# Patient Record
Sex: Female | Born: 1975 | Race: White | Hispanic: No | Marital: Married | Smoking: Never smoker
Health system: Southern US, Community
[De-identification: ages and names within clinical notes are randomized; demographics above are authoritative.]

## PROBLEM LIST (undated history)

## (undated) DIAGNOSIS — M1990 Unspecified osteoarthritis, unspecified site: Secondary | ICD-10-CM

## (undated) DIAGNOSIS — F419 Anxiety disorder, unspecified: Secondary | ICD-10-CM

---
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
## 1998-10-06 ENCOUNTER — Other Ambulatory Visit: Admission: RE | Admit: 1998-10-06 | Discharge: 1998-10-06 | Payer: Self-pay | Admitting: Obstetrics and Gynecology

## 1998-11-15 ENCOUNTER — Other Ambulatory Visit: Admission: RE | Admit: 1998-11-15 | Discharge: 1998-11-15 | Payer: Self-pay | Admitting: Obstetrics and Gynecology

## 1998-11-15 ENCOUNTER — Encounter (INDEPENDENT_AMBULATORY_CARE_PROVIDER_SITE_OTHER): Payer: Self-pay | Admitting: Specialist

## 1999-05-19 ENCOUNTER — Other Ambulatory Visit: Admission: RE | Admit: 1999-05-19 | Discharge: 1999-05-19 | Payer: Self-pay | Admitting: Obstetrics and Gynecology

## 1999-05-28 ENCOUNTER — Observation Stay (HOSPITAL_COMMUNITY): Admission: EM | Admit: 1999-05-28 | Discharge: 1999-05-30 | Payer: Self-pay | Admitting: Emergency Medicine

## 1999-05-28 ENCOUNTER — Encounter (HOSPITAL_BASED_OUTPATIENT_CLINIC_OR_DEPARTMENT_OTHER): Payer: Self-pay | Admitting: General Surgery

## 1999-08-15 ENCOUNTER — Other Ambulatory Visit: Admission: RE | Admit: 1999-08-15 | Discharge: 1999-08-15 | Payer: Self-pay | Admitting: Obstetrics and Gynecology

## 1999-08-29 ENCOUNTER — Encounter: Admission: RE | Admit: 1999-08-29 | Discharge: 1999-08-29 | Payer: Self-pay | Admitting: Obstetrics and Gynecology

## 1999-08-29 ENCOUNTER — Encounter: Payer: Self-pay | Admitting: Obstetrics and Gynecology

## 2000-01-19 ENCOUNTER — Other Ambulatory Visit: Admission: RE | Admit: 2000-01-19 | Discharge: 2000-01-19 | Payer: Self-pay | Admitting: Obstetrics and Gynecology

## 2000-08-23 ENCOUNTER — Other Ambulatory Visit: Admission: RE | Admit: 2000-08-23 | Discharge: 2000-08-23 | Payer: Self-pay | Admitting: Obstetrics and Gynecology

## 2001-12-10 ENCOUNTER — Other Ambulatory Visit: Admission: RE | Admit: 2001-12-10 | Discharge: 2001-12-10 | Payer: Self-pay | Admitting: Obstetrics and Gynecology

## 2002-02-24 ENCOUNTER — Other Ambulatory Visit: Admission: RE | Admit: 2002-02-24 | Discharge: 2002-02-24 | Payer: Self-pay | Admitting: Obstetrics and Gynecology

## 2002-03-06 ENCOUNTER — Ambulatory Visit (HOSPITAL_COMMUNITY): Admission: RE | Admit: 2002-03-06 | Discharge: 2002-03-06 | Payer: Self-pay | Admitting: Obstetrics and Gynecology

## 2002-03-06 ENCOUNTER — Encounter (INDEPENDENT_AMBULATORY_CARE_PROVIDER_SITE_OTHER): Payer: Self-pay

## 2002-05-16 ENCOUNTER — Emergency Department (HOSPITAL_COMMUNITY): Admission: EM | Admit: 2002-05-16 | Discharge: 2002-05-17 | Payer: Self-pay | Admitting: Emergency Medicine

## 2002-10-30 ENCOUNTER — Ambulatory Visit (HOSPITAL_COMMUNITY): Admission: RE | Admit: 2002-10-30 | Discharge: 2002-10-30 | Payer: Self-pay | Admitting: Obstetrics and Gynecology

## 2002-10-30 ENCOUNTER — Encounter: Payer: Self-pay | Admitting: Obstetrics and Gynecology

## 2002-11-20 ENCOUNTER — Encounter: Payer: Self-pay | Admitting: Obstetrics and Gynecology

## 2002-11-20 ENCOUNTER — Ambulatory Visit (HOSPITAL_COMMUNITY): Admission: RE | Admit: 2002-11-20 | Discharge: 2002-11-20 | Payer: Self-pay | Admitting: Obstetrics and Gynecology

## 2003-01-04 ENCOUNTER — Encounter: Payer: Self-pay | Admitting: Obstetrics and Gynecology

## 2003-01-04 ENCOUNTER — Ambulatory Visit (HOSPITAL_COMMUNITY): Admission: RE | Admit: 2003-01-04 | Discharge: 2003-01-04 | Payer: Self-pay | Admitting: Obstetrics and Gynecology

## 2003-01-11 ENCOUNTER — Other Ambulatory Visit: Admission: RE | Admit: 2003-01-11 | Discharge: 2003-01-11 | Payer: Self-pay | Admitting: Obstetrics and Gynecology

## 2003-01-13 ENCOUNTER — Inpatient Hospital Stay (HOSPITAL_COMMUNITY): Admission: AD | Admit: 2003-01-13 | Discharge: 2003-01-13 | Payer: Self-pay | Admitting: Obstetrics and Gynecology

## 2003-02-04 ENCOUNTER — Encounter: Admission: RE | Admit: 2003-02-04 | Discharge: 2003-02-04 | Payer: Self-pay | Admitting: Obstetrics and Gynecology

## 2003-02-15 ENCOUNTER — Ambulatory Visit (HOSPITAL_COMMUNITY): Admission: RE | Admit: 2003-02-15 | Discharge: 2003-02-15 | Payer: Self-pay | Admitting: Obstetrics and Gynecology

## 2003-03-31 ENCOUNTER — Inpatient Hospital Stay (HOSPITAL_COMMUNITY): Admission: AD | Admit: 2003-03-31 | Discharge: 2003-04-04 | Payer: Self-pay | Admitting: Obstetrics and Gynecology

## 2003-04-01 ENCOUNTER — Encounter (INDEPENDENT_AMBULATORY_CARE_PROVIDER_SITE_OTHER): Payer: Self-pay | Admitting: *Deleted

## 2005-03-11 ENCOUNTER — Emergency Department (HOSPITAL_COMMUNITY): Admission: EM | Admit: 2005-03-11 | Discharge: 2005-03-11 | Payer: Self-pay | Admitting: Emergency Medicine

## 2006-02-16 ENCOUNTER — Emergency Department (HOSPITAL_COMMUNITY): Admission: EM | Admit: 2006-02-16 | Discharge: 2006-02-16 | Payer: Self-pay | Admitting: Emergency Medicine

## 2006-03-01 ENCOUNTER — Encounter: Admission: RE | Admit: 2006-03-01 | Discharge: 2006-03-01 | Payer: Self-pay | Admitting: Gastroenterology

## 2007-04-14 ENCOUNTER — Inpatient Hospital Stay (HOSPITAL_COMMUNITY): Admission: AD | Admit: 2007-04-14 | Discharge: 2007-04-14 | Payer: Self-pay | Admitting: Obstetrics and Gynecology

## 2007-05-14 ENCOUNTER — Inpatient Hospital Stay (HOSPITAL_COMMUNITY): Admission: AD | Admit: 2007-05-14 | Discharge: 2007-05-14 | Payer: Self-pay | Admitting: Obstetrics and Gynecology

## 2007-06-19 ENCOUNTER — Encounter (INDEPENDENT_AMBULATORY_CARE_PROVIDER_SITE_OTHER): Payer: Self-pay | Admitting: Obstetrics and Gynecology

## 2007-06-19 ENCOUNTER — Inpatient Hospital Stay (HOSPITAL_COMMUNITY): Admission: AD | Admit: 2007-06-19 | Discharge: 2007-06-22 | Payer: Self-pay | Admitting: Obstetrics and Gynecology

## 2010-08-08 NOTE — Op Note (Signed)
NAMEMarland Kitchen  [name], [name]          ACCOUNT NO.:  [id number]   MEDICAL RECORD NO.:  [id number]          PATIENT TYPE:  INP   LOCATION:  9130                          [facility]ILITY:  [facility]   PHYSICIAN:  Malachi Pro. Ambrose Mantle, M.D. DATE OF BIRTH:  [birth year]   DATE OF PROCEDURE:  06/19/2007  DATE OF DISCHARGE:                               OPERATIVE REPORT   PREOPERATIVE DIAGNOSIS:  Intrauterine pregnancy 37+ weeks.  Premature  rupture of membranes.  Prior C-section.  Declined vacuum-assisted  closure.   POSTOPERATIVE DIAGNOSIS:  Intrauterine pregnancy 37+ weeks.  Premature  rupture of membranes.  Prior C-section.  Declined vacuum-assisted  closure.  Very small seedling fibroids no more than a centimeter in  diameter, probably 3 and 3 all together.   OPERATION:  Low-transverse cervical C-section.   OPERATOR:  Malachi Pro. Ambrose Mantle, M.D.   Spinal anesthesia.   The patient was brought to the operating room and given a spinal  anesthetic by Dr. Pamalee Leyden.  Fetal heart tones were auscultated and normal  in the 130s.  The patient was placed in the left lateral tilt position.  The abdomen was prepped with Betadine solution.  The urethra were  prepped, and a Foley catheter was inserted to straight drain.  The  abdomen was then draped as a sterile field.  The old scar was utilized  to make an incision through the skin, subcutaneous tissue, and fascia.  The fascia was separated from the rectus muscles superiorly and  inferiorly.  The rectus muscle was split in the midline.  Peritoneum  opened vertically.  The lower uterine segment was exposed, and incision  was made into the myometrium in a transverse manner.  I went the rest  away into the amniotic sac with my finger and then enlarged the incision  bilaterally with bandage scissors, elevated the vertex into the  incisional opening, and with fundal pressure was able to deliver the  head through the incision.  The cord dropped down beside the shoulder,  but  it was not a nuchal cord.  The rest of the body was delivered after  the nose and mouth were suctioned with the bulb.  The cord was clamped.  The infant was given to Dr. Venetia Constable who was in attendance.  She  assigned the 6-pound 13-ounce female infant Apgars of 9 at one and 9 at  five minutes.  Placenta was removed.  The entire uterus was inspected,  found to be free of any debris.  The cervix had been 1 cm dilated, so I  did not dilate it further.  The uterine incision was then closed with 2  running sutures of 0 Vicryl, locking the first layer, nonlocking on the  second layer.  Hemostasis was achieved with a combination of Bovie and a  couple additional sutures of 3-0 Vicryl.  Both tubes and ovaries  appeared normal.  There were probably 3 very small less than 1-cm  seedling fibroids on the uterine surface.  Hemostasis was completed  after irrigation was done, and the abdominal wall was closed with  interrupted sutures of 0 Vicryl incorporating the rectus muscle and  peritoneum  in 1 layer.  The fascia was closed  with 2 running sutures of 0 Vicryl, the subcutaneous with a running 3-0  Vicryl, and skin was reapproximated with automatic staples.  The patient  seemed to tolerate the procedure well.  Blood loss was about 1000 mL.  Sponge and needle counts were correct, and she was returned to recovery  in satisfactory condition.      Malachi Pro. Ambrose Mantle, M.D.  Electronically Signed     TFH/MEDQ  D:  06/19/2007  T:  06/20/2007  Job:  [id number]

## 2010-08-08 NOTE — H&P (Signed)
NAMEMarland Kitchen  [name], [name] NO.:  [id number]   MEDICAL RECORD NO.:  [id number]          PATIENT TYPE:  MAT   LOCATION:  MATC                          [facility]  [facility]   PHYSICIAN:  Malachi Pro. Ambrose Mantle, M.D. DATE OF BIRTH:  1975-07-24   DATE OF ADMISSION:  06/19/2007  DATE OF DISCHARGE:                              HISTORY & PHYSICAL   PRESENT ILLNESS:  This is a 35 year old white married female, para 1-0-1-  1, gravida 3, EDC July 07, 2007 by ultrasound, admitted with premature  rupture of membranes and a prior C-section, desiring repeat C-section.  Blood group and type A negative with a negative antibody, nonreactive  serology, rubella immune, hepatitis B surface antigen negative, HIV  negative, GC and Chlamydia negative, 1-hour Glucola 94. group B strep  negative.  Vaginal ultrasound December 04, 2006:  Crown-rump length  2.54 cm, 9 weeks 2 days, [facility] July 07, 2007.  Repeat ultrasound on  February 11, 2007:  Average gestational age [redacted] weeks and 6 days.  EDC by  that ultrasound July 01, 2007.  This was repeated for full anatomic  evaluation on March 03, 2007 and no abnormalities were seen.  Prenatal  course was uncomplicated.  Nonstress tests were begun for small for  dates on May 25, 2007.  She measured 31 cm at 36 weeks and 3 days but  ultrasound suggested the baby was actually large for dates. in the 74th  percentile.  Fluid volume was normal.  At approximately 3 a.m. today the  patient began leaking pink-tinged fluid that had become clear and she  came to the hospital for evaluation.  Her cervix was basically closed.  She was not having labor-like pains and she is scheduled for repeat C-  section.   ALLERGIES:  No known allergies.   PAST SURGICAL HISTORY:  1. 1990 jaw surgery.  2. 2003, D&C.  3. 2005 C-section.   PAST MEDICAL HISTORY:  She has had a history of panic attacks   FAMILY HISTORY:  Father with diabetes.  Paternal grandfather and  maternal  grandparents with heart disease.  Parents have high blood  pressure. Brother has high blood pressure and grandparents also.  Paternal aunt with epilepsy. Maternal aunt with ovarian cancer.   OBSTETRICAL HISTORY:  1. In 2003 the patient had a blighted ovum requiring D&C.  2. January 2005, a 9 pound 2 ounce female by C-section for arrest of      dilatation and recurrent fetal heart rate decelerations.   PHYSICAL EXAMINATION:  VITAL SIGNS:  Physical exam on admission reveals  normal vital signs.  HEART:  Normal size and sounds.  No murmurs.  LUNGS:  Lungs are clear to auscultation.  ABDOMEN:  Soft and gravid.  Old transverse scar.  PELVIC:  There is fluid in the vagina.  Cervix is basically closed.  Presenting part feels fairly low and -2 station.   IMPRESSION:  1. Intrauterine pregnancy at 37-1/2 weeks.  2. Prior C-section.  3. Declined vaginal birth after cesarean section.  4. Rupture of membranes.   PLAN:  The patient is admitted for repeat C-section.  Malachi Pro. Ambrose Mantle, M.D.  Electronically Signed     TFH/MEDQ  D:  06/19/2007  T:  06/19/2007  Job:  [id number]

## 2010-08-08 NOTE — Discharge Summary (Signed)
NAMEMarland Kitchen  [name] [name], [name] [name]          ACCOUNT NO.:  [id number]   MEDICAL RECORD NO.:  [id number]          PATIENT TYPE:  INP   LOCATION:  9130                          [facility]   PHYSICIAN:  Malachi Pro. Ambrose Mantle, M.D. DATE OF BIRTH:  Nov 22, 1975   DATE OF ADMISSION:  06/19/2007  DATE OF DISCHARGE:  06/22/2007                               DISCHARGE SUMMARY   HISTORY:  This is a 35 year old white married female para 1-0-1-1,  gravida 3, EDC July 07, 2007, by ultrasound, admitted with premature  rupture of the membranes on a prior C-section desiring repeat C-section.  Blood-group and type was A negative with a negative antibody,  nonreactive serology, rubella immune, hepatitis B surface antigen  negative, HIV negative, GC and Chlamydia negative, 1-hour Glucola 94,  group B strep negative.  The patient's prenatal course is documented in  the history and physical.  She was admitted with ruptured membranes,  proceeded to the operating room and underwent a low transverse cervical  C-section by Dr. Ambrose Mantle with delivery of a female infant 6 pounds 13  ounces with Apgars of 9 at one and 9 at five minutes.  Small seedling  fibroid was noticed on the uterine surface, no more than 3 of them.  Tubes and ovaries were normal.  Postoperatively, the patient did well  and was discharged on the third postpartum day.  The patient was seen by  social work to assess needs because of history of panic attacks.  The  patient stated that she had panic attacks in grad school, but no real  issue since.  She had been on Zoloft prior to pregnancy, but off since.  She was coping well with no concerns, has good support, and her husband  is very involved.  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  The patient did well postoperatively and was  discharged on the third postop day.  The staples were removed and strips  were applied.  Incision was healing well.   LABORATORY DATA:   Hemoglobin 12.6, hematocrit 36.0, white count 12,000,  platelets count 229,000, postop hemoglobin was 11.1.  RPR was  nonreactive.  Baby was Rh-negative.  The patient was not a candidate for  RhoGAM.   FINAL DIAGNOSES:  Intrauterine pregnancy at 37 plus weeks with premature  rupture of the membranes, prior C-section, declined vaginal birth after  cesarean section.   OPERATION:  Low transverse cervical C-section.   FINAL CONDITION:  Improved.   INSTRUCTIONS:  Include our regular discharge instruction booklet.  The  patient is given a prescription for Percocet 5/325, 36 tablets one every  4-6 hours as needed for pain and Motrin 600 mg one every 6 hours as  needed for pain.  She is to return to the office in 10-14 days for  followup examination.      Malachi Pro. Ambrose Mantle, M.D.  Electronically Signed     TFH/MEDQ  D:  06/22/2007  T:  06/23/2007  Job:  [id number]

## 2010-08-11 NOTE — Op Note (Signed)
   NAMEMarland [name]  TATEANNA, BACH                    ACCOUNT NO.:  [id number]   MEDICAL RECORD NO.:  [id number]                   PATIENT TYPE:  AMB   LOCATION:  SDC                                  [facility]   PHYSICIAN:  Maxie Better, M.D.            DATE OF BIRTH:  [birth year]   DATE OF PROCEDURE:  03/06/2002  DATE OF DISCHARGE:                                 OPERATIVE REPORT   PREOPERATIVE DIAGNOSIS:  Missed abortion.   PROCEDURE:  Suction dilatation and evacuation.   POSTOPERATIVE DIAGNOSIS:  Missed abortion.   ANESTHESIA:  MAC, paracervical block.   SURGEON:  Maxie Better, M.D.   INDICATIONS:  This is a 35 year old gravida 1, para 0 female at 6-1/[redacted] weeks  gestation diagnosed with a missed abortion who now presents for surgical  management.  The patient is known to have a blood type of A-.  Risks and  benefits of procedure have been explained to the patient.  Consent was  signed.  The patient was transferred to the operating room.   PROCEDURE:  Under adequate monitored anesthesia the patient was placed in a  dorsal lithotomy position.  Examination under anesthesia revealed a 7 week  _________ to retroverted uterus.  No adnexal masses could be appreciated.  The patient was sterilely prepped and draped in the usual fashion.  The  bladder was catheterized; however, no urine was noted.  A bivalve speculum  was placed in the vagina.  22.5 cc of 1% Nesacaine was injected  paracervically at 3 and 9 o'clock.  The anterior lip of the cervix was  grasped with a single tooth tenaculum.  The cervical os was serially dilated  up to number 25 Pratt dilator and a number 7 mm curved suction cannula was  gently introduced into the uterine cavity without incident.  Large amount of  products of conception was obtained.  The cavity was then curetted and  resuctioned.  When all tissue was felt to have been removed, all instruments  were then removed from the vagina.  Specimen  labeled products of  conception was sent to pathology.  Estimated blood loss was minimal.  Complications none.  The patient tolerated procedure well.  Was transferred  to recovery room in stable condition.  She will receive RhoGAM in the  recovery room.                                               Maxie Better, M.D.    [place]/MEDQ  D:  03/06/2002  T:  03/06/2002  Job:  [id number]

## 2010-08-11 NOTE — H&P (Signed)
NAMEMarland Kitchen  [name] [name], [name] [name]                    ACCOUNT NO.:  [id number]   MEDICAL RECORD NO.:  [id number]                   PATIENT TYPE:  INP   LOCATION:  9308                                 [facility]   PHYSICIAN:  [name] [name], M.D.              DATE OF BIRTH:  08-24-1975   DATE OF ADMISSION:  03/31/2003  DATE OF DISCHARGE:                                HISTORY & PHYSICAL   PRESENT ILLNESS:  A 35 year old white married female, para 0-0-1-0, gravida  2, [facility] April 02, 2003, by ultrasound admitted with premature rupture of  membranes. Blood group and type A negative, negative antibody, nonreactive  serology, rubella immune, hepatitis B surface antigen negative, HIV negative  on December 2003.  GC and Chlamydia negative. Triple screen normal. One-hour  Glucola 95.  Group B strep negative.  Vaginal ultrasound on September 01, 2002,  crown rump length 2.83 cm, 9 weeks 1 day, [facility] April 02, 2003. Repeat  ultrasound on October 30, 2002, average gestational age [redacted] weeks 5 days,  showed excellent growth, there was mild bilateral fetal renal pyelectasis  that persisted on followup ultrasounds. Prenatal care was uncomplicated  otherwise. Today, the patient had spontaneous rupture of membranes with  clear fluid at 9:30 a.m. She came to our office and fluid was seen coming  from the cervix on speculum exam. Crist Fat was positive.   PAST MEDICAL HISTORY:  No known allergies.  Operations--jaw surgery in 1990,  D&C in 2003; she did have cryo surgery four years ago for an abnormal Pap  smear.  Illnesses--panic attacks.   FAMILY HISTORY:  Parents--father has high blood pressure and diabetes;  mother with high blood pressure; brother with high blood pressure; maternal  aunt and paternal aunt showed ovarian cancer.   PHYSICAL EXAMINATION:  VITAL SIGNS: Temperature 98.3, blood pressure 132/80,  pulse 80, respirations 20.  HEENT: Normal.  HEART: Normal size and sounds. No murmurs.  LUNGS: Clear to  P&A.  ABDOMEN: Soft; fundal height 36 cm; fetal heart tones have been repetitively  showing decelerations that usually have been correctable by position,  oxygen, and decreasing Pitocin. Contractions at the present are every three  minutes. Cervix is 8-9 cm, vertex, -2.   IMPRESSION:  1. Intrauterine pregnancy at 39+ weeks.  2. Premature rupture of membranes.   The patient was placed on Pitocin at 1 milliunit a minute and gradually  increased.  At 5:50 p.m. she had reached 3 cm and got no relief from Stadol,  so an epidural was given.  By 5:50 p.m., contractions every three minutes on  14 milliunits a minute of Pitocin, the cervix was 3-4 cm.  By 9:30 p.m., the  patient had some decelerations usually related to position. Pitocin was at  20 milliunits a minute, cervix was 7-8 cm. At 10:30 p.m., I thought the  cervix had contracted to 7 cm. At 11:30 p.m., the cervix was 8-9 cm and over  the  previous 30 minutes she had again began exhibit late decelerations. I  felt that she needed to be delivered by Cesarean section for failure to  progress beyond 8-  9 cm and repetitive decelerations of the fetal heart rate. The operating  room is not available at this point. A second team has been called, Pitocin  has been discontinued, and we will be vigilant about the heart rate waiting  for the operating room to be available.                                               [name] [name], M.D.    TFH/MEDQ  D:  04/01/2003  T:  04/01/2003  Job:  [id number]

## 2010-08-11 NOTE — Op Note (Signed)
NAMEMarland Kitchen  [name] [name], [name] [name]                    ACCOUNT NO.:  [id number]   MEDICAL RECORD NO.:  [id number]                   PATIENT TYPE:  INP   LOCATION:  9308                                 [facility]   PHYSICIAN:  Malachi Pro. Ambrose Mantle, M.D.              DATE OF BIRTH:  [birth year]   DATE OF PROCEDURE:  04/01/2003  DATE OF DISCHARGE:                                 OPERATIVE REPORT   PREOPERATIVE DIAGNOSES:  1. Intrauterine pregnancy, 39+ weeks.  2. Premature rupture of the membranes.  3. Arrest of dilatation.  4. Recurrent fetal heart rate decelerations.   POSTOPERATIVE DIAGNOSES:  1. Intrauterine pregnancy, 39+ weeks.  2. Premature rupture of the membranes.  3. Arrest of dilatation.  4. Recurrent fetal heart rate decelerations.  5. Small uterine fibroid on the posterior surface of the uterus     approximately 1-2 cm.   OPERATION:  Low transverse cervical cesarean section.   OPERATOR:  Malachi Pro. Ambrose Mantle, M.D.   Epidural anesthesia.   The patient was brought to the operating room and the epidural anesthetic  was boosted.  Fetal heart rate was seen to be normal.  She was placed in a  left lateral tilt position.  The abdomen was prepped with Betadine solution.  The bladder was already catheterized with the Foley, and there was bloody  urine.  After the abdomen was draped as a sterile field after prepping with  Betadine solution, anesthesia was confirmed by grasping with an Allis clamp.  The patient seemed to be quite apprehensive, and I did inject a little bit  of local on the right side to ensure that she did not feel anything.  I then  made the incision transversely, carried it in layers through the skin,  subcutaneous tissue, and fascia.  The fascia was then separated from the  rectus muscle superiorly.  The rectus muscle was then split in the midline.  The peritoneum was opened vertically.  The bladder was apparently not  draining because there was a grapefruit-size mass  of bladder in our  operative field.  I pulled this inferiorly as well as I could, incised the  peritoneum over the lower uterine segment, and pulled the bladder  inferiorly.  I then incised the lower uterine segment, obtained clear fluid.  I opened the uterine incision with blunt dissection by pulling superiorly  and inferiorly on the uterine muscle.  The infant's head was dislodged from  the pelvis and delivered through the incisional opening.  The cord was  clamped.  The infant was given to Angelita Ingles, M.D., who was in  attendance.  It was a female infant, 9 pounds 4 ounces, Apgars were given of 8  and 9 at one and five minutes.  The placenta was removed intact.  The uterus  was delivered into the operative field.  All membranes were removed and the  inside of the uterus was inspected and found to be  free of debris.  The  lower part of the incision of the uterus was quite thin.  I used two layers  of 0 Vicryl to close the uterine incision, locking the first layer,  nonlocking in the second layer, and then used additional sutures on the left  third of the incision for further hemostasis and imbrication.  Hemostasis  was confirmed at the right uterine angle with interrupted figure-of-eight  sutures of 3-0 Vicryl.  Liberal irrigation confirmed hemostasis.  Both tubes  and ovaries appeared normal.  The uterus was normal except for a 1-2 cm  fibroid on the posterior uterine surface.  I did not reperitonelize either  the uterine or the abdominal wall peritoneum.  I reapproximated the rectus  muscle with interrupted sutures of 0 Vicryl, closed the fascia with two  running sutures of 0 Vicryl, the subcu tissue with a running 3-0 Vicryl, and  the skin was closed with automatic staples.  The patient seemed to tolerate  the procedure well.  Blood loss was estimated at about 1000 mL.  Sponge and  needle counts were correct, and the patient returned to recovery in  satisfactory condition.                                                Malachi Pro. Ambrose Mantle, M.D.    TFH/MEDQ  D:  04/01/2003  T:  04/01/2003  Job:  [id number]

## 2010-08-11 NOTE — Discharge Summary (Signed)
NAMEMarland Kitchen  [name] [name], [name] [name]                    ACCOUNT NO.:  [id number]   MEDICAL RECORD NO.:  [id number]                   PATIENT TYPE:  INP   LOCATION:  9308                                 [facility]   PHYSICIAN:  Malachi Pro. Ambrose Mantle, M.D.              DATE OF BIRTH:  04-Jun-1975   DATE OF ADMISSION:  03/31/2003  DATE OF DISCHARGE:  04/04/2003                                 DISCHARGE SUMMARY   HOSPITAL COURSE:  This is a 35 year old white married female who was  admitted with premature rupture of membranes for induction of labor.  Blood  group and type A negative, negative antibody, nonreactive serology, rubella  immune, hepatitis B surface antigen negative, HIV negative.  GC and  Chlamydia negative, triple screen normal.  One-hour Glucola 95.  Group B  strep negative.  The patient's prenatal course is outlined in her History  and Physical.  After admission to the hospital, she was placed on Pitocin.  By 5:50 p.m., she had reached 3 to 4 cm and had an epidural.  She  subsequently had some late decelerations usually related to position, and by  9:30 p.m. the cervix had reached 7 to 8 cm dilated.  By 10:30 p.m., I  thought the cervix had shrunk to 7 cm.  At 11:30 p.m., there were again some  late decelerations, position was changed, Pitocin was decreased from 22 to  18 milliunits a minute, the decelerations appeared to have corrected and  cervix exam at 11:30 p.m. was 8 to 9 cm.  By 12:37 a.m., over the last 30  minutes the patient had more late decelerations, the cervix had remained at  8 to 9 cm and I proceeded with cesarean section.  The patient underwent a  low transverse cervical cesarean section by Dr. Ambrose Mantle under epidural  anesthesia for pregnancy of 39+ weeks with premature rupture of membranes,  arrest of dilatation, and recurrent fetal heart rate decelerations.  The  baby was 9 pounds 2 ounces, a female infant, Apgars 8 at one and 9 at five  minutes, only abnormality at the  time of the C-section was a 2 cm fibroid on  the posterior aspect of the fundus.  Postpartum, the patient did quite well  and was discharged on the third postop day.  Staples were removed, strips  were applied.  The baby was Rh positive.  The patient received RhoGAM.  Initial hemoglobin 12, hematocrit 34.6, white count 12,200, platelet count  275,000.  Follow up hemoglobin 11.1, RPR was nonreactive.  The patient did  receive her RhoGAM.  The baby was A positive.  The date and the time of the  RhoGAM was not listed.  However, it was given within the 72-hour period.   FINAL DIAGNOSES:  1. Intrauterine pregnancy at 39+ weeks.  2. Premature rupture of membranes.  3. Arrest of cervical dilatation.  4. Leiomyomata uteri.   OPERATION:  Low transverse cervical cesarean section.  FINAL CONDITION:  Improved.   Instructions include our regular discharge instruction booklet.  Percocet  5/325 mg, dispense #20 tablets, one every 4-6 hours as needed for pain is  given at discharge.                                               Malachi Pro. Ambrose Mantle, M.D.    TFH/MEDQ  D:  04/04/2003  T:  04/04/2003  Job:  [id number]

## 2010-12-14 LAB — GLUCOSE TOLERANCE, 1 HOUR: Glucose, 1 Hour GTT: 94

## 2010-12-14 LAB — RH IMMUNE GLOBULIN WORKUP (NOT WOMEN'S HOSP)
ABO/RH(D): A NEG
Antibody Screen: NEGATIVE

## 2010-12-14 LAB — RPR: RPR Ser Ql: NONREACTIVE

## 2010-12-18 LAB — CBC
MCHC: 35.1
Platelets: 206
Platelets: 229
RDW: 15.3
WBC: 12.4 — ABNORMAL HIGH

## 2010-12-18 LAB — RPR: RPR Ser Ql: NONREACTIVE

## 2011-01-08 ENCOUNTER — Other Ambulatory Visit: Payer: Self-pay | Admitting: Dermatology

## 2011-03-21 ENCOUNTER — Ambulatory Visit (INDEPENDENT_AMBULATORY_CARE_PROVIDER_SITE_OTHER): Payer: BC Managed Care – PPO

## 2011-03-21 DIAGNOSIS — M79609 Pain in unspecified limb: Secondary | ICD-10-CM

## 2011-03-21 DIAGNOSIS — S93529A Sprain of metatarsophalangeal joint of unspecified toe(s), initial encounter: Secondary | ICD-10-CM

## 2015-07-06 DIAGNOSIS — J320 Chronic maxillary sinusitis: Secondary | ICD-10-CM | POA: Diagnosis not present

## 2015-07-11 DIAGNOSIS — Z113 Encounter for screening for infections with a predominantly sexual mode of transmission: Secondary | ICD-10-CM | POA: Diagnosis not present

## 2015-07-11 DIAGNOSIS — N870 Mild cervical dysplasia: Secondary | ICD-10-CM | POA: Diagnosis not present

## 2015-07-11 DIAGNOSIS — L900 Lichen sclerosus et atrophicus: Secondary | ICD-10-CM | POA: Diagnosis not present

## 2015-07-11 DIAGNOSIS — N898 Other specified noninflammatory disorders of vagina: Secondary | ICD-10-CM | POA: Diagnosis not present

## 2015-07-11 DIAGNOSIS — Z124 Encounter for screening for malignant neoplasm of cervix: Secondary | ICD-10-CM | POA: Diagnosis not present

## 2015-07-26 DIAGNOSIS — Z1231 Encounter for screening mammogram for malignant neoplasm of breast: Secondary | ICD-10-CM | POA: Diagnosis not present

## 2015-08-24 DIAGNOSIS — Z0000 Encounter for general adult medical examination without abnormal findings: Secondary | ICD-10-CM | POA: Diagnosis not present

## 2016-01-16 DIAGNOSIS — Z130 Encounter for screening for diseases of the blood and blood-forming organs and certain disorders involving the immune mechanism: Secondary | ICD-10-CM | POA: Diagnosis not present

## 2016-01-16 DIAGNOSIS — Z124 Encounter for screening for malignant neoplasm of cervix: Secondary | ICD-10-CM | POA: Diagnosis not present

## 2016-01-16 DIAGNOSIS — R8290 Unspecified abnormal findings in urine: Secondary | ICD-10-CM | POA: Diagnosis not present

## 2016-01-16 DIAGNOSIS — Z01419 Encounter for gynecological examination (general) (routine) without abnormal findings: Secondary | ICD-10-CM | POA: Diagnosis not present

## 2016-01-16 DIAGNOSIS — Z3041 Encounter for surveillance of contraceptive pills: Secondary | ICD-10-CM | POA: Diagnosis not present

## 2016-01-16 DIAGNOSIS — Z1389 Encounter for screening for other disorder: Secondary | ICD-10-CM | POA: Diagnosis not present

## 2016-03-29 DIAGNOSIS — L309 Dermatitis, unspecified: Secondary | ICD-10-CM | POA: Diagnosis not present

## 2016-03-29 DIAGNOSIS — L42 Pityriasis rosea: Secondary | ICD-10-CM | POA: Diagnosis not present

## 2016-04-20 DIAGNOSIS — D225 Melanocytic nevi of trunk: Secondary | ICD-10-CM | POA: Diagnosis not present

## 2016-04-20 DIAGNOSIS — D224 Melanocytic nevi of scalp and neck: Secondary | ICD-10-CM | POA: Diagnosis not present

## 2016-04-20 DIAGNOSIS — B070 Plantar wart: Secondary | ICD-10-CM | POA: Diagnosis not present

## 2016-04-20 DIAGNOSIS — L858 Other specified epidermal thickening: Secondary | ICD-10-CM | POA: Diagnosis not present

## 2016-07-10 DIAGNOSIS — N870 Mild cervical dysplasia: Secondary | ICD-10-CM | POA: Diagnosis not present

## 2016-08-13 DIAGNOSIS — Z1231 Encounter for screening mammogram for malignant neoplasm of breast: Secondary | ICD-10-CM | POA: Diagnosis not present

## 2016-09-16 DIAGNOSIS — J018 Other acute sinusitis: Secondary | ICD-10-CM | POA: Diagnosis not present

## 2017-01-21 DIAGNOSIS — Z01419 Encounter for gynecological examination (general) (routine) without abnormal findings: Secondary | ICD-10-CM | POA: Diagnosis not present

## 2017-01-21 DIAGNOSIS — Z3041 Encounter for surveillance of contraceptive pills: Secondary | ICD-10-CM | POA: Diagnosis not present

## 2017-01-21 DIAGNOSIS — Z124 Encounter for screening for malignant neoplasm of cervix: Secondary | ICD-10-CM | POA: Diagnosis not present

## 2017-01-21 DIAGNOSIS — Z1389 Encounter for screening for other disorder: Secondary | ICD-10-CM | POA: Diagnosis not present

## 2017-01-21 DIAGNOSIS — L900 Lichen sclerosus et atrophicus: Secondary | ICD-10-CM | POA: Diagnosis not present

## 2017-01-21 DIAGNOSIS — Z6831 Body mass index (BMI) 31.0-31.9, adult: Secondary | ICD-10-CM | POA: Diagnosis not present

## 2017-01-21 DIAGNOSIS — Z130 Encounter for screening for diseases of the blood and blood-forming organs and certain disorders involving the immune mechanism: Secondary | ICD-10-CM | POA: Diagnosis not present

## 2017-01-22 DIAGNOSIS — Z124 Encounter for screening for malignant neoplasm of cervix: Secondary | ICD-10-CM | POA: Diagnosis not present

## 2017-04-22 DIAGNOSIS — N909 Noninflammatory disorder of vulva and perineum, unspecified: Secondary | ICD-10-CM | POA: Diagnosis not present

## 2017-04-22 DIAGNOSIS — Z3041 Encounter for surveillance of contraceptive pills: Secondary | ICD-10-CM | POA: Diagnosis not present

## 2017-04-22 DIAGNOSIS — L900 Lichen sclerosus et atrophicus: Secondary | ICD-10-CM | POA: Diagnosis not present

## 2017-05-14 DIAGNOSIS — J0100 Acute maxillary sinusitis, unspecified: Secondary | ICD-10-CM | POA: Diagnosis not present

## 2017-05-14 DIAGNOSIS — F411 Generalized anxiety disorder: Secondary | ICD-10-CM | POA: Diagnosis not present

## 2017-05-15 DIAGNOSIS — A630 Anogenital (venereal) warts: Secondary | ICD-10-CM | POA: Diagnosis not present

## 2017-06-10 DIAGNOSIS — A630 Anogenital (venereal) warts: Secondary | ICD-10-CM | POA: Diagnosis not present

## 2017-06-18 DIAGNOSIS — D2261 Melanocytic nevi of right upper limb, including shoulder: Secondary | ICD-10-CM | POA: Diagnosis not present

## 2017-06-18 DIAGNOSIS — D224 Melanocytic nevi of scalp and neck: Secondary | ICD-10-CM | POA: Diagnosis not present

## 2017-06-18 DIAGNOSIS — L858 Other specified epidermal thickening: Secondary | ICD-10-CM | POA: Diagnosis not present

## 2017-06-18 DIAGNOSIS — D225 Melanocytic nevi of trunk: Secondary | ICD-10-CM | POA: Diagnosis not present

## 2017-06-19 ENCOUNTER — Other Ambulatory Visit: Payer: Self-pay | Admitting: Family Medicine

## 2017-06-19 DIAGNOSIS — Z8249 Family history of ischemic heart disease and other diseases of the circulatory system: Secondary | ICD-10-CM

## 2017-06-19 DIAGNOSIS — Z0000 Encounter for general adult medical examination without abnormal findings: Secondary | ICD-10-CM | POA: Diagnosis not present

## 2017-07-01 ENCOUNTER — Ambulatory Visit
Admission: RE | Admit: 2017-07-01 | Discharge: 2017-07-01 | Disposition: A | Discharge status: Home / Self Care | Payer: BLUE CROSS/BLUE SHIELD | Source: Ambulatory Visit | Attending: Family Medicine | Admitting: Family Medicine

## 2017-07-01 DIAGNOSIS — Z8249 Family history of ischemic heart disease and other diseases of the circulatory system: Secondary | ICD-10-CM

## 2017-07-10 DIAGNOSIS — F411 Generalized anxiety disorder: Secondary | ICD-10-CM | POA: Diagnosis not present

## 2017-07-17 DIAGNOSIS — A630 Anogenital (venereal) warts: Secondary | ICD-10-CM | POA: Diagnosis not present

## 2017-07-30 DIAGNOSIS — F411 Generalized anxiety disorder: Secondary | ICD-10-CM | POA: Diagnosis not present

## 2017-08-21 DIAGNOSIS — A630 Anogenital (venereal) warts: Secondary | ICD-10-CM | POA: Diagnosis not present

## 2018-02-06 ENCOUNTER — Other Ambulatory Visit: Payer: Self-pay | Admitting: Family Medicine

## 2018-02-06 DIAGNOSIS — D1803 Hemangioma of intra-abdominal structures: Secondary | ICD-10-CM

## 2018-02-06 DIAGNOSIS — Z8249 Family history of ischemic heart disease and other diseases of the circulatory system: Secondary | ICD-10-CM

## 2018-02-13 ENCOUNTER — Ambulatory Visit
Admission: RE | Admit: 2018-02-13 | Discharge: 2018-02-13 | Disposition: A | Discharge status: Home / Self Care | Payer: BLUE CROSS/BLUE SHIELD | Source: Ambulatory Visit | Attending: Family Medicine | Admitting: Family Medicine

## 2018-02-13 DIAGNOSIS — Z8249 Family history of ischemic heart disease and other diseases of the circulatory system: Secondary | ICD-10-CM

## 2018-02-13 DIAGNOSIS — D1803 Hemangioma of intra-abdominal structures: Secondary | ICD-10-CM

## 2018-02-28 DIAGNOSIS — Z1389 Encounter for screening for other disorder: Secondary | ICD-10-CM | POA: Diagnosis not present

## 2018-02-28 DIAGNOSIS — Z01419 Encounter for gynecological examination (general) (routine) without abnormal findings: Secondary | ICD-10-CM | POA: Diagnosis not present

## 2018-02-28 DIAGNOSIS — Z130 Encounter for screening for diseases of the blood and blood-forming organs and certain disorders involving the immune mechanism: Secondary | ICD-10-CM | POA: Diagnosis not present

## 2018-02-28 DIAGNOSIS — Z124 Encounter for screening for malignant neoplasm of cervix: Secondary | ICD-10-CM | POA: Diagnosis not present

## 2018-02-28 DIAGNOSIS — Z1231 Encounter for screening mammogram for malignant neoplasm of breast: Secondary | ICD-10-CM | POA: Diagnosis not present

## 2018-07-28 DIAGNOSIS — L720 Epidermal cyst: Secondary | ICD-10-CM | POA: Diagnosis not present

## 2018-07-28 DIAGNOSIS — I8392 Asymptomatic varicose veins of left lower extremity: Secondary | ICD-10-CM | POA: Diagnosis not present

## 2018-07-28 DIAGNOSIS — D225 Melanocytic nevi of trunk: Secondary | ICD-10-CM | POA: Diagnosis not present

## 2018-07-28 DIAGNOSIS — D2272 Melanocytic nevi of left lower limb, including hip: Secondary | ICD-10-CM | POA: Diagnosis not present

## 2019-01-22 DIAGNOSIS — E782 Mixed hyperlipidemia: Secondary | ICD-10-CM | POA: Diagnosis not present

## 2019-01-22 DIAGNOSIS — Z23 Encounter for immunization: Secondary | ICD-10-CM | POA: Diagnosis not present

## 2019-01-22 DIAGNOSIS — Z0000 Encounter for general adult medical examination without abnormal findings: Secondary | ICD-10-CM | POA: Diagnosis not present

## 2019-02-21 ENCOUNTER — Encounter (HOSPITAL_COMMUNITY): Payer: Self-pay

## 2019-02-21 ENCOUNTER — Emergency Department (HOSPITAL_COMMUNITY): Payer: BC Managed Care – PPO

## 2019-02-21 ENCOUNTER — Inpatient Hospital Stay (HOSPITAL_COMMUNITY)
Admission: EM | Admit: 2019-02-21 | Discharge: 2019-02-24 | DRG: 066 | Disposition: A | Discharge status: Home / Self Care | Payer: BC Managed Care – PPO | Attending: Neurology | Admitting: Neurology

## 2019-02-21 ENCOUNTER — Other Ambulatory Visit: Payer: Self-pay

## 2019-02-21 ENCOUNTER — Inpatient Hospital Stay (HOSPITAL_COMMUNITY): Payer: BC Managed Care – PPO

## 2019-02-21 DIAGNOSIS — R402362 Coma scale, best motor response, obeys commands, at arrival to emergency department: Secondary | ICD-10-CM | POA: Diagnosis present

## 2019-02-21 DIAGNOSIS — G43009 Migraine without aura, not intractable, without status migrainosus: Secondary | ICD-10-CM | POA: Diagnosis not present

## 2019-02-21 DIAGNOSIS — Z20828 Contact with and (suspected) exposure to other viral communicable diseases: Secondary | ICD-10-CM | POA: Diagnosis present

## 2019-02-21 DIAGNOSIS — I611 Nontraumatic intracerebral hemorrhage in hemisphere, cortical: Secondary | ICD-10-CM | POA: Diagnosis not present

## 2019-02-21 DIAGNOSIS — I618 Other nontraumatic intracerebral hemorrhage: Secondary | ICD-10-CM | POA: Diagnosis not present

## 2019-02-21 DIAGNOSIS — Z8249 Family history of ischemic heart disease and other diseases of the circulatory system: Secondary | ICD-10-CM

## 2019-02-21 DIAGNOSIS — G43909 Migraine, unspecified, not intractable, without status migrainosus: Secondary | ICD-10-CM | POA: Diagnosis present

## 2019-02-21 DIAGNOSIS — E785 Hyperlipidemia, unspecified: Secondary | ICD-10-CM | POA: Diagnosis not present

## 2019-02-21 DIAGNOSIS — G44209 Tension-type headache, unspecified, not intractable: Secondary | ICD-10-CM | POA: Diagnosis present

## 2019-02-21 DIAGNOSIS — R519 Headache, unspecified: Secondary | ICD-10-CM | POA: Diagnosis not present

## 2019-02-21 DIAGNOSIS — I639 Cerebral infarction, unspecified: Secondary | ICD-10-CM | POA: Diagnosis not present

## 2019-02-21 DIAGNOSIS — Z79890 Hormone replacement therapy (postmenopausal): Secondary | ICD-10-CM

## 2019-02-21 DIAGNOSIS — R402142 Coma scale, eyes open, spontaneous, at arrival to emergency department: Secondary | ICD-10-CM | POA: Diagnosis present

## 2019-02-21 DIAGNOSIS — R402252 Coma scale, best verbal response, oriented, at arrival to emergency department: Secondary | ICD-10-CM | POA: Diagnosis present

## 2019-02-21 DIAGNOSIS — I6389 Other cerebral infarction: Secondary | ICD-10-CM | POA: Diagnosis not present

## 2019-02-21 DIAGNOSIS — I619 Nontraumatic intracerebral hemorrhage, unspecified: Secondary | ICD-10-CM | POA: Diagnosis present

## 2019-02-21 DIAGNOSIS — Z79899 Other long term (current) drug therapy: Secondary | ICD-10-CM

## 2019-02-21 DIAGNOSIS — Z833 Family history of diabetes mellitus: Secondary | ICD-10-CM | POA: Diagnosis not present

## 2019-02-21 DIAGNOSIS — H547 Unspecified visual loss: Secondary | ICD-10-CM | POA: Diagnosis not present

## 2019-02-21 DIAGNOSIS — S0003XA Contusion of scalp, initial encounter: Secondary | ICD-10-CM | POA: Diagnosis not present

## 2019-02-21 HISTORY — DX: Nontraumatic intracerebral hemorrhage, unspecified: I61.9

## 2019-02-21 LAB — URINALYSIS, ROUTINE W REFLEX MICROSCOPIC
Bilirubin Urine: NEGATIVE
Glucose, UA: NEGATIVE mg/dL
Hgb urine dipstick: NEGATIVE
Ketones, ur: NEGATIVE mg/dL
Leukocytes,Ua: NEGATIVE
Nitrite: NEGATIVE
Protein, ur: NEGATIVE mg/dL
Specific Gravity, Urine: 1.021 (ref 1.005–1.030)
pH: 6 (ref 5.0–8.0)

## 2019-02-21 LAB — CBC
HCT: 43.8 % (ref 36.0–46.0)
Hemoglobin: 14.3 g/dL (ref 12.0–15.0)
MCH: 29.3 pg (ref 26.0–34.0)
MCHC: 32.6 g/dL (ref 30.0–36.0)
MCV: 89.8 fL (ref 80.0–100.0)
Platelets: 248 10*3/uL (ref 150–400)
RBC: 4.88 MIL/uL (ref 3.87–5.11)
RDW: 13.4 % (ref 11.5–15.5)
WBC: 7.1 10*3/uL (ref 4.0–10.5)
nRBC: 0 % (ref 0.0–0.2)

## 2019-02-21 LAB — DIFFERENTIAL
Abs Immature Granulocytes: 0.08 10*3/uL — ABNORMAL HIGH (ref 0.00–0.07)
Basophils Absolute: 0 10*3/uL (ref 0.0–0.1)
Basophils Relative: 1 %
Eosinophils Absolute: 0.1 10*3/uL (ref 0.0–0.5)
Eosinophils Relative: 1 %
Immature Granulocytes: 1 %
Lymphocytes Relative: 23 %
Lymphs Abs: 1.6 10*3/uL (ref 0.7–4.0)
Monocytes Absolute: 0.5 10*3/uL (ref 0.1–1.0)
Monocytes Relative: 7 %
Neutro Abs: 4.8 10*3/uL (ref 1.7–7.7)
Neutrophils Relative %: 67 %

## 2019-02-21 LAB — RAPID URINE DRUG SCREEN, HOSP PERFORMED
Amphetamines: NOT DETECTED
Barbiturates: NOT DETECTED
Benzodiazepines: NOT DETECTED
Cocaine: NOT DETECTED
Opiates: NOT DETECTED
Tetrahydrocannabinol: NOT DETECTED

## 2019-02-21 LAB — COMPREHENSIVE METABOLIC PANEL
ALT: 21 U/L (ref 0–44)
AST: 20 U/L (ref 15–41)
Albumin: 3.9 g/dL (ref 3.5–5.0)
Alkaline Phosphatase: 49 U/L (ref 38–126)
Anion gap: 8 (ref 5–15)
BUN: 13 mg/dL (ref 6–20)
CO2: 24 mmol/L (ref 22–32)
Calcium: 8.9 mg/dL (ref 8.9–10.3)
Chloride: 105 mmol/L (ref 98–111)
Creatinine, Ser: 0.72 mg/dL (ref 0.44–1.00)
GFR calc Af Amer: >60 mL/min (ref >60)
GFR calc non Af Amer: >60 mL/min (ref >60)
Glucose, Bld: 108 mg/dL — ABNORMAL HIGH (ref 70–99)
Potassium: 3.5 mmol/L (ref 3.5–5.1)
Sodium: 137 mmol/L (ref 135–145)
Total Bilirubin: 0.5 mg/dL (ref 0.3–1.2)
Total Protein: 7.5 g/dL (ref 6.5–8.1)

## 2019-02-21 LAB — HIV ANTIBODY (ROUTINE TESTING W REFLEX): HIV Screen 4th Generation wRfx: NONREACTIVE

## 2019-02-21 LAB — ETHANOL: Alcohol, Ethyl (B): <10 mg/dL (ref <10)

## 2019-02-21 LAB — MRSA PCR SCREENING: MRSA by PCR: NEGATIVE

## 2019-02-21 LAB — APTT: aPTT: 27 seconds (ref 24–36)

## 2019-02-21 LAB — PROTIME-INR
INR: 1 (ref 0.8–1.2)
Prothrombin Time: 13.2 seconds (ref 11.4–15.2)

## 2019-02-21 LAB — I-STAT BETA HCG BLOOD, ED (MC, WL, AP ONLY): I-stat hCG, quantitative: <5 m[IU]/mL (ref <5)

## 2019-02-21 LAB — SARS CORONAVIRUS 2 (TAT 6-24 HRS): SARS Coronavirus 2: NEGATIVE

## 2019-02-21 MED ORDER — GADOBUTROL 1 MMOL/ML IV SOLN
7.0000 mL | Freq: Once | INTRAVENOUS | Status: AC | PRN
  Administered 2019-02-21: 7 mL via INTRAVENOUS

## 2019-02-21 MED ORDER — NICARDIPINE HCL IN NACL 20-0.86 MG/200ML-% IV SOLN: 0.0000 mg/h | INTRAVENOUS | Status: DC

## 2019-02-21 MED ORDER — ACETAMINOPHEN 325 MG PO TABS
650.0000 mg | ORAL_TABLET | ORAL | Status: DC | PRN
  Administered 2019-02-21 – 2019-02-23 (×8): 650 mg via ORAL
  Filled 2019-02-21 (×8): qty 2

## 2019-02-21 MED ORDER — LABETALOL HCL 5 MG/ML IV SOLN: 10.0000 mg | INTRAVENOUS | Status: DC | PRN

## 2019-02-21 MED ORDER — LORAZEPAM 2 MG/ML IJ SOLN
1.0000 mg | Freq: Once | INTRAMUSCULAR | Status: AC
  Administered 2019-02-21: 1 mg via INTRAVENOUS
  Filled 2019-02-21: qty 1

## 2019-02-21 MED ORDER — CHLORHEXIDINE GLUCONATE CLOTH 2 % EX PADS
6.0000 | MEDICATED_PAD | Freq: Every day | CUTANEOUS | Status: DC
  Administered 2019-02-21 – 2019-02-23 (×3): 6 via TOPICAL

## 2019-02-21 MED ORDER — ACETAMINOPHEN 325 MG PO TABS
650.0000 mg | ORAL_TABLET | Freq: Once | ORAL | Status: AC
  Administered 2019-02-21: 650 mg via ORAL
  Filled 2019-02-21: qty 2

## 2019-02-21 MED ORDER — IOHEXOL 350 MG/ML SOLN
80.0000 mL | Freq: Once | INTRAVENOUS | Status: AC | PRN
  Administered 2019-02-21: 80 mL via INTRAVENOUS

## 2019-02-21 MED ORDER — SALINE SPRAY 0.65 % NA SOLN
1.0000 | NASAL | Status: DC | PRN
  Administered 2019-02-21: 1 via NASAL
  Filled 2019-02-21: qty 44

## 2019-02-21 MED ORDER — STROKE: EARLY STAGES OF RECOVERY BOOK
Freq: Once | Status: AC
  Administered 2019-02-21: 15:00:00
  Filled 2019-02-21: qty 1

## 2019-02-21 MED ORDER — ACETAMINOPHEN 650 MG RE SUPP: 650.0000 mg | RECTAL | Status: DC | PRN

## 2019-02-21 MED ORDER — ACETAMINOPHEN 160 MG/5ML PO SOLN: 650.0000 mg | ORAL | Status: DC | PRN

## 2019-02-21 MED ORDER — ONDANSETRON HCL 4 MG/2ML IJ SOLN
4.0000 mg | Freq: Four times a day (QID) | INTRAMUSCULAR | Status: DC | PRN
  Administered 2019-02-21 – 2019-02-23 (×2): 4 mg via INTRAVENOUS
  Filled 2019-02-21 (×2): qty 2

## 2019-02-21 MED ORDER — SENNOSIDES-DOCUSATE SODIUM 8.6-50 MG PO TABS
1.0000 | ORAL_TABLET | Freq: Two times a day (BID) | ORAL | Status: DC
  Administered 2019-02-21 – 2019-02-22 (×2): 1 via ORAL
  Filled 2019-02-21 (×2): qty 1

## 2019-02-21 MED ORDER — TRAMADOL HCL 50 MG PO TABS
50.0000 mg | ORAL_TABLET | Freq: Once | ORAL | Status: AC
  Administered 2019-02-21: 50 mg via ORAL
  Filled 2019-02-21: qty 1

## 2019-02-21 MED ORDER — SODIUM CHLORIDE (PF) 0.9 % IJ SOLN
INTRAMUSCULAR | Status: AC
  Filled 2019-02-21: qty 50

## 2019-02-21 MED ORDER — PANTOPRAZOLE SODIUM 40 MG IV SOLR
40.0000 mg | Freq: Every day | INTRAVENOUS | Status: DC
  Administered 2019-02-21: 40 mg via INTRAVENOUS
  Filled 2019-02-21: qty 40

## 2019-02-21 NOTE — H&P (Addendum)
NEURO HOSPITALIST CONSULT H&P   CC: vision loss History obtained from:  Patient   HPI:                                                                                                                                          [name] [name] is an 43 y.o. female  With no significant past medical history who presented to [facility] with C/o of vision loss and HA. transferred to [facility] ed for management of ICH.  At about 1730 yesterday patient had a HA on the right side of her head. She described it as a sharp pain like a stabbing sensation. This pain lasted approximately 30 minutes. She noticed a loss of vision on the left side of her visual field when she was looking at her cell phone for the time and could only see the 30 of 5:30. She reports trouble reading words also. She can " fill in the blanks/ guess what the word should be" , but words are missing the letters on the left side. She went to bed thinking her vision would improve. It did not improve and she came to the hospital. Denies smoking history. Patient is on sertraline and birth control with no recent changes to medications. Patient reports that she has had migraines in the past where she has seen spots, but nothing like this. She has a HA about once per week, but nothing this bad.    Hospital course: CT: 2.9 acute right occipital intraparenchymal hemorrhage CTA:no LVO;  BP: 125/88 BG: 108 ICH score: 0  History reviewed. No pertinent past medical history.  Past Surgical History:  Procedure Laterality Date  . CESAREAN SECTION      Family History  Problem Relation Age of Onset  . Hypertension Mother   . Hypertension Father   . Diabetes Father          Social History:  reports that she has never smoked. She has never used smokeless tobacco. She reports that she does not drink alcohol or use drugs.  Allergies  Allergen Reactions  . Other     "berries"    MEDICATIONS:  Scheduled: . Chlorhexidine Gluconate Cloth  6 each Topical Daily  . sodium chloride (PF)       Continuous: . niCARDipine     PRN:   ROS:                                                                                                                                       ROS was performed and is negative except as noted in HPI    Blood pressure 125/88, pulse 94, temperature 98.1 F (36.7 C), temperature source Oral, resp. rate 17, height 5\' 7"  (1.702 m), weight 72.6 kg, last menstrual period 02/14/2019, SpO2 99 %.   General Examination:                                                                                                       Physical Exam  Constitutional: Appears well-developed and well-nourished.  Psych: Affect appropriate to situation Eyes: Normal external eye and conjunctiva. HENT: Normocephalic, no lesions, without obvious abnormality.   Musculoskeletal-no joint tenderness, deformity or swelling Cardiovascular: Normal rate and regular rhythm.  Respiratory: Effort normal, non-labored breathing saturations WNL on RA GI: Soft.  No distension. There is no tenderness.  Skin: WDI  Neurological Examination Mental Status: Alert, oriented, thought content appropriate.  Speech fluent without evidence of aphasia.  Able to follow  commands without difficulty. Cranial Nerves: II:  Visual fields grossly normal,  III,IV, VI: ptosis not present, extra-ocular motions intact bilaterally pupils equal, round, reactive to light and accommodation V,VII: smile symmetric, facial light touch sensation normal bilaterally VIII: hearing normal bilaterally IX,X: uvula rises symmetrically XI: bilateral shoulder shrug XII: midline tongue extension Motor: Right : Upper extremity   5/5   Left:     Upper extremity   5/5  Lower extremity   5/5    Lower extremity   5/5 Tone and bulk:normal tone throughout; no atrophy  noted Sensory: cool temp and light touch intact throughout, bilaterally Deep Tendon Reflexes: 2+ and symmetric throughout Plantars: Right: downgoing   Left: downgoing Cerebellar: normal finger-to-nose,  Gait: deferred   Lab Results: Basic Metabolic Panel: Recent Labs  Lab 02/21/19 1003  NA 137  K 3.5  CL 105  CO2 24  GLUCOSE 108*  BUN 13  CREATININE 0.72  CALCIUM 8.9    CBC: Recent Labs  Lab 02/21/19 1003  WBC 7.1  NEUTROABS 4.8  HGB 14.3  HCT 43.8  MCV 89.8  PLT 248    Imaging: Ct Angio Head  W Or Wo Contrast  Result Date: 02/21/2019 CLINICAL DATA:  Severe right-sided headache with left hemianopsia. EXAM: CT ANGIOGRAPHY HEAD AND NECK TECHNIQUE: Multidetector CT imaging of the head and neck was performed using the standard protocol during bolus administration of intravenous contrast. Multiplanar CT image reconstructions and MIPs were obtained to evaluate the vascular anatomy. Carotid stenosis measurements (when applicable) are obtained utilizing NASCET criteria, using the distal internal carotid diameter as the denominator. CONTRAST:  65mL OMNIPAQUE IOHEXOL 350 MG/ML SOLN COMPARISON:  None. FINDINGS: CT HEAD FINDINGS Brain: An acute intraparenchymal hemorrhage in the posterolateral right occipital lobe measures 2.9 x 1.8 x 1.9 cm (approximate volume of 5.0 mL). There is mild surrounding edema without significant mass effect. Separate from this hemorrhage, no acute infarct is evident. There is no midline shift or extra-axial fluid collection. The ventricles are normal in size. Vascular: Reported below. Skull: No fracture or focal osseous lesion. Sinuses: Visualized paranasal sinuses and mastoid air cells are clear. Orbits: Unremarkable. Review of the MIP images confirms the above findings CTA NECK FINDINGS Aortic arch: Standard 3 vessel aortic arch with widely patent arch vessel origins. Right carotid system: Patent and smooth without evidence of stenosis or dissection. Left  carotid system: Patent and smooth without evidence of stenosis or dissection. Vertebral arteries: Patent and smooth without evidence of stenosis or dissection. Codominant. Skeleton: Mini plate and screw fixation of the maxilla and mandible. Other neck: No evidence of cervical lymphadenopathy or mass. Upper chest: Clear lung apices. Review of the MIP images confirms the above findings CTA HEAD FINDINGS Anterior circulation: The internal carotid arteries are widely patent from skull base to carotid termini. ACAs and MCAs are patent without evidence of proximal branch occlusion or significant proximal stenosis. The right A1 segment is diminutive or absent. No aneurysm or vascular malformation is identified. Posterior circulation: The intracranial vertebral arteries are widely patent to the basilar. Patent left PICA, right AICA, and bilateral SCA origins are identified. The basilar artery is widely patent. There are right larger than left posterior communicating arteries with hypoplasia of the right P1 segment. Both PCAs are patent without evidence of significant proximal stenosis. No aneurysm or vascular malformation is identified with special attention to the right occipital hemorrhage. Venous sinuses: Patent. Anatomic variants: Diminutive or absent right A1 segment. Review of the MIP images confirms the above findings IMPRESSION: 1. 2.9 cm acute right occipital intraparenchymal hemorrhage. 2. Patent Circle of Willis without major branch occlusion, significant proximal stenosis, aneurysm, or vascular malformation. 3. Widely patent cervical carotid and vertebral arteries. Critical Value/emergent results were called by telephone at the time of interpretation on 02/21/2019 at 11:11 am to provider Carlisle Cater, who verbally acknowledged these results. Electronically Signed   By: Logan Bores M.D.   On: 02/21/2019 11:13   Ct Angio Neck W And/or Wo Contrast  Result Date: 02/21/2019 CLINICAL DATA:  Severe right-sided  headache with left hemianopsia. EXAM: CT ANGIOGRAPHY HEAD AND NECK TECHNIQUE: Multidetector CT imaging of the head and neck was performed using the standard protocol during bolus administration of intravenous contrast. Multiplanar CT image reconstructions and MIPs were obtained to evaluate the vascular anatomy. Carotid stenosis measurements (when applicable) are obtained utilizing NASCET criteria, using the distal internal carotid diameter as the denominator. CONTRAST:  73mL OMNIPAQUE IOHEXOL 350 MG/ML SOLN COMPARISON:  None. FINDINGS: CT HEAD FINDINGS Brain: An acute intraparenchymal hemorrhage in the posterolateral right occipital lobe measures 2.9 x 1.8 x 1.9 cm (approximate volume of 5.0 mL). There is mild surrounding  edema without significant mass effect. Separate from this hemorrhage, no acute infarct is evident. There is no midline shift or extra-axial fluid collection. The ventricles are normal in size. Vascular: Reported below. Skull: No fracture or focal osseous lesion. Sinuses: Visualized paranasal sinuses and mastoid air cells are clear. Orbits: Unremarkable. Review of the MIP images confirms the above findings CTA NECK FINDINGS Aortic arch: Standard 3 vessel aortic arch with widely patent arch vessel origins. Right carotid system: Patent and smooth without evidence of stenosis or dissection. Left carotid system: Patent and smooth without evidence of stenosis or dissection. Vertebral arteries: Patent and smooth without evidence of stenosis or dissection. Codominant. Skeleton: Mini plate and screw fixation of the maxilla and mandible. Other neck: No evidence of cervical lymphadenopathy or mass. Upper chest: Clear lung apices. Review of the MIP images confirms the above findings CTA HEAD FINDINGS Anterior circulation: The internal carotid arteries are widely patent from skull base to carotid termini. ACAs and MCAs are patent without evidence of proximal branch occlusion or significant proximal stenosis.  The right A1 segment is diminutive or absent. No aneurysm or vascular malformation is identified. Posterior circulation: The intracranial vertebral arteries are widely patent to the basilar. Patent left PICA, right AICA, and bilateral SCA origins are identified. The basilar artery is widely patent. There are right larger than left posterior communicating arteries with hypoplasia of the right P1 segment. Both PCAs are patent without evidence of significant proximal stenosis. No aneurysm or vascular malformation is identified with special attention to the right occipital hemorrhage. Venous sinuses: Patent. Anatomic variants: Diminutive or absent right A1 segment. Review of the MIP images confirms the above findings IMPRESSION: 1. 2.9 cm acute right occipital intraparenchymal hemorrhage. 2. Patent Circle of Willis without major branch occlusion, significant proximal stenosis, aneurysm, or vascular malformation. 3. Widely patent cervical carotid and vertebral arteries. Critical Value/emergent results were called by telephone at the time of interpretation on 02/21/2019 at 11:11 am to provider Carlisle Cater, who verbally acknowledged these results. Electronically Signed   By: Logan Bores M.D.   On: 02/21/2019 11:13    Assessment:  [name] [name] is an 43 y.o. female  With no significant past medical history who presented to [facility] with C/o of vision loss and HA. transferred to [facility] ed for management of ICH.  The location is very unusual and the etiology is therefore very unclear.  I think further imaging with an MRI/MRV would be prudent.  Possible etiologies include hemorrhagic conversion of an ischemic infarct, hemorrhagic metastasis, AVM, unlikely amyloid or hypertension.  No symptoms to suggest vasculitis or venous sinus thrombosis.   Plan: -- CNS -Close neuro monitoring   RESP No acute issues - monitor  CV -Aggressive BP control, goal SBP  <140 unless  - labetalol push ordered  To keep SBP< 140 -  monitor  GI/GU -Gentle hydration - doc/senna for constipation  - monitor  HEME -Monitor -transfuse for hgb < 7  ENDO -goal HgbA1c < 7  ID -Monitor  Prophylaxis DVT: SCD's GI: doc/senna   Dispo: TBD  Diet: NPO until cleared by speech or bedside swallow eval  Code Status: Full Code     Laurey Morale, MSN, NP-C [facility] [phone]  Attending neurologist's note to follow I have seen the patient reviewed the above note.  She will need to be admitted for work-up as above.  It is very unclear as to what the etiology of her hemorrhages.  We are doing further work-up to look  for etiologies.  Labetalol for blood pressure control.  This patient is critically ill and at significant risk of neurological worsening, death and care requires constant monitoring of vital signs, hemodynamics,respiratory and cardiac monitoring, neurological assessment, discussion with family, other specialists and medical decision making of high complexity. I spent 40 minutes of neurocritical care time  in the care of  this patient. This was time spent independent of any time provided by nurse practitioner or PA.  Roland Rack, MD [facility] [phone]  If 7pm- 7am, please page neurology on call as listed in [place]. 02/21/2019  7:45 PM   02/21/2019, 1:16 PM

## 2019-02-21 NOTE — ED Triage Notes (Signed)
I have just given report to Anderson Malta, RN on [facility] ICU [place]. I will notify [facility] for transport now.

## 2019-02-21 NOTE — ED Provider Notes (Signed)
[facility] DEPT Provider Note   CSN: [id number] Arrival date & time: 02/21/19  0909     History   Chief Complaint Chief Complaint  Patient presents with  . Eye Problem  . Headache    HPI [name] [name] is a 43 y.o. female.     Patient with history of estrogen use, no smoking history, brother who passed away from an abdominal aneurysm --presents to the emergency department today with complaint of vision changes.  Patient developed a right-sided headache yesterday at approximately 2:30 PM.  She has been getting more frequent headaches lately that she attributes to tension.  This headache gradually became worse and by about 4:30 PM she described it as a very sharp pain starting in the right posterior head through the lateral side of the head.  She states that she rested in a hot tub and took a shower and gradually the pain improved.  However, patient developed binocular left-sided visual deficits.  She does not describe complete blindness in the L visual fields, however it is more like she can't read words. She also states her mind "fills in the blanks" when reading words or looking at faces. Patient denies signs of stroke including: facial droop, slurred speech, aphasia, weakness/numbness in extremities, imbalance/trouble walking.  No neck pain. The onset of this condition was acute. Pain is improved but neuro deficit does not.  Aggravating factors: none. Alleviating factors: none.        History reviewed. No pertinent past medical history.  There are no active problems to display for this patient.   Past Surgical History:  Procedure Laterality Date  . CESAREAN SECTION       OB History   No obstetric history on file.      Home Medications    Prior to Admission medications   Medication Sig Start Date End Date Taking? Authorizing Provider  JUNEL FE 1/20 1-20 MG-MCG tablet Take 1 tablet by mouth daily. 12/11/18   [provider]   sertraline (ZOLOFT) 100 MG tablet Take 100 mg by mouth daily. 11/26/18   [provider]    Family History Family History  Problem Relation Age of Onset  . Hypertension Mother   . Hypertension Father   . Diabetes Father     Social History Social History   Tobacco Use  . Smoking status: Never Smoker  . Smokeless tobacco: Never Used  Substance Use Topics  . Alcohol use: Never    Frequency: Never  . Drug use: Never     Allergies   Other   Review of Systems Review of Systems  Constitutional: Negative for fever.  HENT: Negative for congestion, dental problem, rhinorrhea and sinus pressure.   Eyes: Positive for visual disturbance. Negative for photophobia, discharge and redness.  Respiratory: Negative for shortness of breath.   Cardiovascular: Negative for chest pain.  Gastrointestinal: Negative for nausea and vomiting.  Musculoskeletal: Negative for gait problem, neck pain and neck stiffness.  Skin: Negative for rash.  Neurological: Positive for headaches. Negative for syncope, speech difficulty, weakness, light-headedness and numbness.  Psychiatric/Behavioral: Negative for confusion.     Physical Exam Updated Vital Signs BP 121/82 (BP Location: Left Arm)   Pulse 83   Temp 98.1 F (36.7 C) (Oral)   Resp 16   Ht 5\' 7"  (1.702 m)   Wt 72.6 kg   LMP 02/14/2019   SpO2 100%   BMI 25.06 kg/m   Physical Exam Vitals signs and nursing note reviewed.  Constitutional:      Appearance: She is well-developed.  HENT:     Head: Normocephalic and atraumatic.     Right Ear: Tympanic membrane, ear canal and external ear normal.     Left Ear: Tympanic membrane, ear canal and external ear normal.     Nose: Nose normal.     Mouth/Throat:     Pharynx: Uvula midline.  Eyes:     General: Lids are normal.     Extraocular Movements: Extraocular movements intact.     Right eye: No nystagmus.     Left eye: No nystagmus.     Conjunctiva/sclera: Conjunctivae normal.      Pupils: Pupils are equal, round, and reactive to light. Pupils are equal.  Neck:     Musculoskeletal: Normal range of motion and neck supple.  Cardiovascular:     Rate and Rhythm: Normal rate and regular rhythm.  Pulmonary:     Effort: Pulmonary effort is normal.     Breath sounds: Normal breath sounds.  Abdominal:     Palpations: Abdomen is soft.     Tenderness: There is no abdominal tenderness.  Musculoskeletal:     Cervical back: She exhibits normal range of motion, no tenderness and no bony tenderness.  Skin:    General: Skin is warm and dry.  Neurological:     Mental Status: She is alert and oriented to person, place, and time.     GCS: GCS eye subscore is 4. GCS verbal subscore is 5. GCS motor subscore is 6.     Cranial Nerves: No cranial nerve deficit.     Sensory: No sensory deficit.     Coordination: Coordination normal.     Gait: Gait normal.     Deep Tendon Reflexes: Reflexes are normal and symmetric.     Comments: On visual field testing, patient can see movement in left and right visual fields.  Deficit described as in HPI.      ED Treatments / Results  Labs (all labs ordered are listed, but only abnormal results are displayed) Labs Reviewed  DIFFERENTIAL - Abnormal; Notable for the following components:      Result Value   Abs Immature Granulocytes 0.08 (*)    All other components within normal limits  COMPREHENSIVE METABOLIC PANEL - Abnormal; Notable for the following components:   Glucose, Bld 108 (*)    All other components within normal limits  MRSA PCR SCREENING  SARS CORONAVIRUS 2 (TAT 6-24 HRS)  ETHANOL  PROTIME-INR  APTT  CBC  RAPID URINE DRUG SCREEN, HOSP PERFORMED  URINALYSIS, ROUTINE W REFLEX MICROSCOPIC  HIV ANTIBODY (ROUTINE TESTING W REFLEX)  I-STAT BETA HCG BLOOD, ED ([facility], [facility], [facility] ONLY)    ED ECG REPORT   Date: 02/21/2019  Rate: 83  Rhythm: normal sinus rhythm  QRS Axis: normal  Intervals: normal  ST/T Wave abnormalities: normal   Conduction Disutrbances:none  Narrative Interpretation:   Old EKG Reviewed: none available  I have personally reviewed the EKG tracing and agree with the computerized printout as noted.  Radiology Ct Angio Head W Or Wo Contrast  Result Date: 02/21/2019 CLINICAL DATA:  Severe right-sided headache with left hemianopsia. EXAM: CT ANGIOGRAPHY HEAD AND NECK TECHNIQUE: Multidetector CT imaging of the head and neck was performed using the standard protocol during bolus administration of intravenous contrast. Multiplanar CT image reconstructions and MIPs were obtained to evaluate the vascular anatomy. Carotid stenosis measurements (when applicable) are obtained utilizing NASCET criteria, using the distal internal carotid  diameter as the denominator. CONTRAST:  24mL OMNIPAQUE IOHEXOL 350 MG/ML SOLN COMPARISON:  None. FINDINGS: CT HEAD FINDINGS Brain: An acute intraparenchymal hemorrhage in the posterolateral right occipital lobe measures 2.9 x 1.8 x 1.9 cm (approximate volume of 5.0 mL). There is mild surrounding edema without significant mass effect. Separate from this hemorrhage, no acute infarct is evident. There is no midline shift or extra-axial fluid collection. The ventricles are normal in size. Vascular: Reported below. Skull: No fracture or focal osseous lesion. Sinuses: Visualized paranasal sinuses and mastoid air cells are clear. Orbits: Unremarkable. Review of the MIP images confirms the above findings CTA NECK FINDINGS Aortic arch: Standard 3 vessel aortic arch with widely patent arch vessel origins. Right carotid system: Patent and smooth without evidence of stenosis or dissection. Left carotid system: Patent and smooth without evidence of stenosis or dissection. Vertebral arteries: Patent and smooth without evidence of stenosis or dissection. Codominant. Skeleton: Mini plate and screw fixation of the maxilla and mandible. Other neck: No evidence of cervical lymphadenopathy or mass. Upper chest: Clear  lung apices. Review of the MIP images confirms the above findings CTA HEAD FINDINGS Anterior circulation: The internal carotid arteries are widely patent from skull base to carotid termini. ACAs and MCAs are patent without evidence of proximal branch occlusion or significant proximal stenosis. The right A1 segment is diminutive or absent. No aneurysm or vascular malformation is identified. Posterior circulation: The intracranial vertebral arteries are widely patent to the basilar. Patent left PICA, right AICA, and bilateral SCA origins are identified. The basilar artery is widely patent. There are right larger than left posterior communicating arteries with hypoplasia of the right P1 segment. Both PCAs are patent without evidence of significant proximal stenosis. No aneurysm or vascular malformation is identified with special attention to the right occipital hemorrhage. Venous sinuses: Patent. Anatomic variants: Diminutive or absent right A1 segment. Review of the MIP images confirms the above findings IMPRESSION: 1. 2.9 cm acute right occipital intraparenchymal hemorrhage. 2. Patent Circle of Willis without major branch occlusion, significant proximal stenosis, aneurysm, or vascular malformation. 3. Widely patent cervical carotid and vertebral arteries. Critical Value/emergent results were called by telephone at the time of interpretation on 02/21/2019 at 11:11 am to provider Carlisle Cater, who verbally acknowledged these results. Electronically Signed   By: Logan Bores M.D.   On: 02/21/2019 11:13   Ct Angio Neck W And/or Wo Contrast  Result Date: 02/21/2019 CLINICAL DATA:  Severe right-sided headache with left hemianopsia. EXAM: CT ANGIOGRAPHY HEAD AND NECK TECHNIQUE: Multidetector CT imaging of the head and neck was performed using the standard protocol during bolus administration of intravenous contrast. Multiplanar CT image reconstructions and MIPs were obtained to evaluate the vascular anatomy. Carotid  stenosis measurements (when applicable) are obtained utilizing NASCET criteria, using the distal internal carotid diameter as the denominator. CONTRAST:  40mL OMNIPAQUE IOHEXOL 350 MG/ML SOLN COMPARISON:  None. FINDINGS: CT HEAD FINDINGS Brain: An acute intraparenchymal hemorrhage in the posterolateral right occipital lobe measures 2.9 x 1.8 x 1.9 cm (approximate volume of 5.0 mL). There is mild surrounding edema without significant mass effect. Separate from this hemorrhage, no acute infarct is evident. There is no midline shift or extra-axial fluid collection. The ventricles are normal in size. Vascular: Reported below. Skull: No fracture or focal osseous lesion. Sinuses: Visualized paranasal sinuses and mastoid air cells are clear. Orbits: Unremarkable. Review of the MIP images confirms the above findings CTA NECK FINDINGS Aortic arch: Standard 3 vessel aortic arch with widely  patent arch vessel origins. Right carotid system: Patent and smooth without evidence of stenosis or dissection. Left carotid system: Patent and smooth without evidence of stenosis or dissection. Vertebral arteries: Patent and smooth without evidence of stenosis or dissection. Codominant. Skeleton: Mini plate and screw fixation of the maxilla and mandible. Other neck: No evidence of cervical lymphadenopathy or mass. Upper chest: Clear lung apices. Review of the MIP images confirms the above findings CTA HEAD FINDINGS Anterior circulation: The internal carotid arteries are widely patent from skull base to carotid termini. ACAs and MCAs are patent without evidence of proximal branch occlusion or significant proximal stenosis. The right A1 segment is diminutive or absent. No aneurysm or vascular malformation is identified. Posterior circulation: The intracranial vertebral arteries are widely patent to the basilar. Patent left PICA, right AICA, and bilateral SCA origins are identified. The basilar artery is widely patent. There are right larger  than left posterior communicating arteries with hypoplasia of the right P1 segment. Both PCAs are patent without evidence of significant proximal stenosis. No aneurysm or vascular malformation is identified with special attention to the right occipital hemorrhage. Venous sinuses: Patent. Anatomic variants: Diminutive or absent right A1 segment. Review of the MIP images confirms the above findings IMPRESSION: 1. 2.9 cm acute right occipital intraparenchymal hemorrhage. 2. Patent Circle of Willis without major branch occlusion, significant proximal stenosis, aneurysm, or vascular malformation. 3. Widely patent cervical carotid and vertebral arteries. Critical Value/emergent results were called by telephone at the time of interpretation on 02/21/2019 at 11:11 am to provider Carlisle Cater, who verbally acknowledged these results. Electronically Signed   By: Logan Bores M.D.   On: 02/21/2019 11:13    Procedures Procedures (including critical care time)  Medications Ordered in ED Medications  sodium chloride (PF) 0.9 % injection (has no administration in time range)  nicardipine (CARDENE) 20mg  in 0.86% saline 231ml IV infusion (0.1 mg/ml) (has no administration in time range)  iohexol (OMNIPAQUE) 350 MG/ML injection 80 mL (80 mLs Intravenous Contrast Given 02/21/19 1050)  LORazepam (ATIVAN) injection 1 mg (1 mg Intravenous Given 02/21/19 1115)     Initial Impression / Assessment and Plan / ED Course  I have reviewed the triage vital signs and the nursing notes.  Pertinent labs & imaging results that were available during my care of the patient were reviewed by me and considered in my medical decision making (see chart for details).        Patient seen and examined. No code stroke -- initial sx at 1430 yesterday (>8 hrs) and no weakness to make her LVO+. Work-up initiated. Broad differential including aneurysm, dural venous sinus thrombosis, hemorrhagic stroke, ischemic stroke, carotid dissection.   Lab work, EKG, CT angio of the head and neck ordered.  Patient may need MRI at this does not explain her symptoms to rule out ischemic stroke.  Vital signs reviewed and are as follows: BP 121/82 (BP Location: Left Arm)   Pulse 83   Temp 98.1 F (36.7 C) (Oral)   Resp 16   Ht 5\' 7"  (1.702 m)   Wt 72.6 kg   LMP 02/14/2019   SpO2 100%   BMI 25.06 kg/m   Notified by CT tech that patient has a bleed.  I went to CT and visualized.  CT angio completed.  Patient discussed with Dr. Zenia Resides.  Discussed with Dr. Leonel Ramsay of neurology.  He has reviewed the images.  Request admission to neuro ICU at [facility].  Discussed Cardene drip for blood  pressure control if systolic blood pressure greater than 140.  Patient and husband updated on results.  Patient became very anxious and was given Ativan which has seemed to help.  Will closely monitor blood pressure.  Discussed imaging with radiologist who states that CTA is negative for aneurysm.  CRITICAL CARE Performed by: Carlisle Cater PA-C Total critical care time: 45 minutes Critical care time was exclusive of separately billable procedures and treating other patients. Critical care was necessary to treat or prevent imminent or life-threatening deterioration. Critical care was time spent personally by me on the following activities: development of treatment plan with patient and/or surrogate as well as nursing, discussions with consultants, evaluation of patient's response to treatment, examination of patient, obtaining history from patient or surrogate, ordering and performing treatments and interventions, ordering and review of laboratory studies, ordering and review of radiographic studies, pulse oximetry and re-evaluation of patient's condition.  12:13 PM patient reassessed.  Awaiting transport to [facility].  She appears comfortable.  Blood pressure looks good at 124/76.  She is requesting something to drink.      Final Clinical Impressions(s) /  ED Diagnoses   Final diagnoses:  Hemorrhagic stroke [facility])   Patient with hemorrhagic stroke, normotensive in ED.  Admit to the neuro ICU at [facility].   ED Discharge Orders    None       Carlisle Cater, Hershal Coria 02/21/19 1826    Lacretia Leigh, MD 02/22/19 [phone]

## 2019-02-21 NOTE — ED Notes (Signed)
[facility] is here at this time. Medication given for generalized h/a. She leaves our department without incident.

## 2019-02-21 NOTE — ED Triage Notes (Signed)
patient reports that she had a headache with sharp pain on the right side of her head at 1730 yesterday. Paitent states the pain lasted 30 minutes. Patient states she has vision loss on the left side of her field of vision for both eyes. patient states she went to bed and thought vision would be better,but has not improved.

## 2019-02-21 NOTE — ED Notes (Signed)
She tells me she is having no pain nor discomfort, including h/a. Her husband is with her. She is aware we are awaiting CT angiography.

## 2019-02-21 NOTE — ED Notes (Signed)
Pt. Aware of urine specimen. Will collect urine when pt. Voids. Nurse aware.  

## 2019-02-21 NOTE — ED Provider Notes (Signed)
Medical screening examination/treatment/procedure(s) were conducted as a shared visit with non-physician practitioner(s) and myself.  I personally evaluated the patient during the encounter.    43 year old female here presents with sharp pain to the right side of her head.  She is awake and alert.  Protecting her airway.  CT scan shows occipital hemorrhage.  Will be admitted to neurology   Lacretia Leigh, MD 02/21/19 1145

## 2019-02-21 NOTE — ED Notes (Signed)
She remains alert and oriented x 4 with clear speech. She has recently ambulated without difficulty. She denies any pain nor discomfort, however, she still c/o visual disturbance; which is to say that everything to left of midline is "real distorted, like a Psychologist, educational". Her husband remains with her also.

## 2019-02-22 ENCOUNTER — Inpatient Hospital Stay (HOSPITAL_COMMUNITY): Payer: BC Managed Care – PPO

## 2019-02-22 DIAGNOSIS — I6389 Other cerebral infarction: Secondary | ICD-10-CM | POA: Diagnosis not present

## 2019-02-22 DIAGNOSIS — I611 Nontraumatic intracerebral hemorrhage in hemisphere, cortical: Secondary | ICD-10-CM | POA: Diagnosis not present

## 2019-02-22 DIAGNOSIS — E785 Hyperlipidemia, unspecified: Secondary | ICD-10-CM

## 2019-02-22 LAB — CBC
HCT: 41.9 % (ref 36.0–46.0)
Hemoglobin: 14.1 g/dL (ref 12.0–15.0)
MCH: 29.5 pg (ref 26.0–34.0)
MCHC: 33.7 g/dL (ref 30.0–36.0)
MCV: 87.7 fL (ref 80.0–100.0)
Platelets: 266 10*3/uL (ref 150–400)
RBC: 4.78 MIL/uL (ref 3.87–5.11)
RDW: 13.3 % (ref 11.5–15.5)
WBC: 10.9 10*3/uL — ABNORMAL HIGH (ref 4.0–10.5)
nRBC: 0 % (ref 0.0–0.2)

## 2019-02-22 LAB — BASIC METABOLIC PANEL
Anion gap: 10 (ref 5–15)
BUN: 11 mg/dL (ref 6–20)
CO2: 23 mmol/L (ref 22–32)
Calcium: 8.8 mg/dL — ABNORMAL LOW (ref 8.9–10.3)
Chloride: 105 mmol/L (ref 98–111)
Creatinine, Ser: 0.72 mg/dL (ref 0.44–1.00)
GFR calc Af Amer: >60 mL/min (ref >60)
GFR calc non Af Amer: >60 mL/min (ref >60)
Glucose, Bld: 109 mg/dL — ABNORMAL HIGH (ref 70–99)
Potassium: 3.8 mmol/L (ref 3.5–5.1)
Sodium: 138 mmol/L (ref 135–145)

## 2019-02-22 LAB — LIPID PANEL
Cholesterol: 180 mg/dL (ref 0–200)
HDL: 43 mg/dL (ref >40)
LDL Cholesterol: 112 mg/dL — ABNORMAL HIGH (ref 0–99)
Total CHOL/HDL Ratio: 4.2 RATIO
Triglycerides: 124 mg/dL (ref <150)
VLDL: 25 mg/dL (ref 0–40)

## 2019-02-22 LAB — HEMOGLOBIN A1C
Hgb A1c MFr Bld: 4.8 % (ref 4.8–5.6)
Mean Plasma Glucose: 91.06 mg/dL

## 2019-02-22 LAB — ECHOCARDIOGRAM COMPLETE
Height: 67 in
Weight: 2659.63 oz

## 2019-02-22 LAB — RPR: RPR Ser Ql: NONREACTIVE

## 2019-02-22 MED ORDER — PANTOPRAZOLE SODIUM 40 MG PO TBEC
40.0000 mg | DELAYED_RELEASE_TABLET | Freq: Every day | ORAL | Status: DC
  Administered 2019-02-22: 40 mg via ORAL
  Filled 2019-02-22: qty 1

## 2019-02-22 MED ORDER — SODIUM CHLORIDE 0.9 % IV SOLN
INTRAVENOUS | Status: DC
  Administered 2019-02-23: 09:00:00 via INTRAVENOUS

## 2019-02-22 NOTE — Evaluation (Addendum)
Physical Therapy Evaluation/ Discharge Patient Details Name: [name] [name] MRN: [id number] DOB: 01-Mar-1976 Today's Date: 02/22/2019   History of Present Illness  43 yo admitted with HA and left visual field loss with CT demonstrating right occipital intraparenchymal hemorrhage. Only significant history is birth control use  Clinical Impression  Pt very pleasant, oriented and cognitively intact. Pt reports slight feeling of unsteadiness due to lying in bed and not sleeping. Pt with only mild balance deficit x 1 with left tandem stance with pt able to recover and repeat without further deficit on BERG or DGI testing. Pt able to read clock at 1045 with initial guessing as pt stating 1145 initially. Pt able to identify all objects in room and hall on both sides with difficulty with numbers and letters on left side. Pt without strength or mobility deficits and very aware of her vision deficits and scanning at times when needed. Pt without further physical therapy needs at this time and will defer to OT with pt and spouse aware and agreeable. Pt encouraged to continue mobility and ambulation acutely.     Follow Up Recommendations No PT follow up    Equipment Recommendations  None recommended by PT    Recommendations for Other Services OT consult     Precautions / Restrictions Precautions Precautions: None      Mobility  Bed Mobility Overal bed mobility: Independent                Transfers Overall transfer level: Independent                  Ambulation/Gait Ambulation/Gait assistance: Independent Gait Distance (Feet): 300 Feet Assistive device: None Gait Pattern/deviations: WFL(Within Functional Limits)   Gait velocity interpretation: >4.37 ft/sec, indicative of normal walking speed General Gait Details: pt with good speed and control with head turns, change of speed and directional changes  Stairs Stairs: Yes Stairs assistance: Independent Stair Management:  No rails;Alternating pattern;Forwards Number of Stairs: 4 General stair comments: pt alternating for ascent with initial step to pattern for descent but on repeated trial able to perform alternating descent  Wheelchair Mobility    Modified Rankin (Stroke Patients Only)       Balance Overall balance assessment: Modified Independent   Sitting balance-Leahy Scale: Normal       Standing balance-Leahy Scale: Normal   Single Leg Stance - Right Leg: 15 Single Leg Stance - Left Leg: 15 Tandem Stance - Right Leg: 30 Tandem Stance - Left Leg: 30 Rhomberg - Eyes Opened: 60 Rhomberg - Eyes Closed: 30   High Level Balance Comments: pt with initial unsteadiness with left tandem stance with pt able to recover without assist and resume tandem on 2nd trial and hold Standardized Balance Assessment Standardized Balance Assessment : Berg Balance Test;Dynamic Gait Index Berg Balance Test Sit to Stand: Able to stand without using hands and stabilize independently Standing Unsupported: Able to stand safely 2 minutes Sitting with Back Unsupported but Feet Supported on Floor or Stool: Able to sit safely and securely 2 minutes Stand to Sit: Sits safely with minimal use of hands Transfers: Able to transfer safely, minor use of hands Standing Unsupported with Eyes Closed: Able to stand 10 seconds safely Standing Ubsupported with Feet Together: Able to place feet together independently and stand 1 minute safely From Standing, Reach Forward with Outstretched Arm: Can reach confidently >25 cm (10") From Standing Position, Pick up Object from Floor: Able to pick up shoe safely and easily From Standing Position,  Turn to Look Behind Over each Shoulder: Looks behind from both sides and weight shifts well Turn 360 Degrees: Able to turn 360 degrees safely in 4 seconds or less Standing Unsupported, Alternately Place Feet on Step/Stool: Able to stand independently and safely and complete 8 steps in 20  seconds Standing Unsupported, One Foot in Front: Able to take small step independently and hold 30 seconds Standing on One Leg: Able to lift leg independently and hold > 10 seconds Total Score: 54 Dynamic Gait Index Level Surface: Normal Change in Gait Speed: Normal Gait with Horizontal Head Turns: Normal Gait with Vertical Head Turns: Normal Gait and Pivot Turn: Normal Step Over Obstacle: Normal Step Around Obstacles: Normal Steps: Normal Total Score: 24       Pertinent Vitals/Pain Pain Assessment: 0-10 Pain Score: 4  Pain Location: HA right Pain Descriptors / Indicators: Aching Pain Intervention(s): Limited activity within patient's tolerance;Monitored during session;Repositioned    Home Living Family/patient expects to be discharged to:: Private residence Living Arrangements: Spouse/significant other;Children Available Help at Discharge: Family;Available 24 hours/day Type of Home: House Home Access: Stairs to enter   [employer] of Steps: 2 Home Layout: One level Home Equipment: None      Prior Function Level of Independence: Independent         Comments: Pt is a Education officer, 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     Hand Dominance        Extremity/Trunk Assessment   Upper Extremity Assessment Upper Extremity Assessment: Overall WFL for tasks assessed    Lower Extremity Assessment Lower Extremity Assessment: Overall WFL for tasks assessed    Cervical / Trunk Assessment Cervical / Trunk Assessment: Normal  Communication   Communication: No difficulties  Cognition Arousal/Alertness: Awake/alert Behavior During Therapy: WFL for tasks assessed/performed Overall Cognitive Status: Within Functional Limits for tasks assessed                                        General Comments      Exercises     Assessment/Plan    PT Assessment Patent does not need any further PT services  PT Problem List         PT Treatment Interventions       PT Goals (Current goals can be found in the Care Plan section)  Acute Rehab PT Goals PT Goal Formulation: All assessment and education complete, DC therapy    Frequency     Barriers to discharge        Co-evaluation               AM-PAC PT "6 Clicks" Mobility  Outcome Measure Help needed turning from your back to your side while in a flat bed without using bedrails?: None Help needed moving from lying on your back to sitting on the side of a flat bed without using bedrails?: None Help needed moving to and from a bed to a chair (including a wheelchair)?: None Help needed standing up from a chair using your arms (e.g., wheelchair or bedside chair)?: None Help needed to walk in hospital room?: None Help needed climbing 3-5 steps with a railing? : None 6 Click Score: 24    End of Session   Activity Tolerance: Patient tolerated treatment well Patient left: in chair;with call bell/phone within reach;with family/visitor present Nurse Communication: Mobility status PT Visit Diagnosis: Other symptoms and signs involving the nervous  system (R29.898)    Time: 1043-1100 PT Time Calculation (min) (ACUTE ONLY): 17 min   Charges:   PT Evaluation $PT Eval Moderate Complexity: 1 Mod          [place], PT Acute Rehabilitation Services Pager: [phone] Office: [phone]   Muneer Leider B Davina Howlett 02/22/2019, 11:10 AM

## 2019-02-22 NOTE — Progress Notes (Signed)
PT Cancellation Note  Patient Details Name: [name] [name] MRN: [id number] DOB: [birth year]   Cancelled Treatment:    Reason Eval/Treat Not Completed: Active bedrest order   Dalexa Gentz B Avan Gullett 02/22/2019, 7:09 AM  Bayard Males, PT Acute Rehabilitation Services Pager: [phone] Office: [phone]

## 2019-02-22 NOTE — Progress Notes (Signed)
STROKE TEAM PROGRESS NOTE   INTERVAL HISTORY Her RN and husband are at the bedside.  Pt still has left visual field blurry but no hemianopia. Discussed about further work up with angiogram. Advised no driving at this time. Pt is in agreement.    OBJECTIVE Vitals:   02/22/19 0100 02/22/19 0200 02/22/19 0300 02/22/19 0400  BP: 110/73 121/78 115/77   Pulse: 75 83 73   Resp: 15 17 13    Temp:    98.9 F (37.2 C)  TempSrc:    Oral  SpO2: 97% 98% 96%   Weight:      Height:        CBC:  Recent Labs  Lab 02/21/19 1003 02/22/19 0400  WBC 7.1 10.9*  NEUTROABS 4.8  --   HGB 14.3 14.1  HCT 43.8 41.9  MCV 89.8 87.7  PLT 248 [id number]    Basic Metabolic Panel:  Recent Labs  Lab 02/21/19 1003 02/22/19 0400  NA 137 138  K 3.5 3.8  CL 105 105  CO2 24 23  GLUCOSE 108* 109*  BUN 13 11  CREATININE 0.72 0.72  CALCIUM 8.9 8.8*    Lipid Panel:     Component Value Date/Time   CHOL 180 02/22/2019 0400   TRIG 124 02/22/2019 0400   HDL 43 02/22/2019 0400   CHOLHDL 4.2 02/22/2019 0400   VLDL 25 02/22/2019 0400   LDLCALC 112 (H) 02/22/2019 0400   HgbA1c: No results found for: HGBA1C Urine Drug Screen:     Component Value Date/Time   LABOPIA NONE DETECTED 02/21/2019 1119   COCAINSCRNUR NONE DETECTED 02/21/2019 1119   LABBENZ NONE DETECTED 02/21/2019 1119   AMPHETMU NONE DETECTED 02/21/2019 1119   THCU NONE DETECTED 02/21/2019 1119   LABBARB NONE DETECTED 02/21/2019 1119    Alcohol Level     Component Value Date/Time   ETH <10 02/21/2019 1003    IMAGING  Ct Angio Head W Or Wo Contrast Ct Angio Neck W And/or Wo Contrast 02/21/2019 IMPRESSION:  1. 2.9 cm acute right occipital intraparenchymal hemorrhage.  2. Patent Circle of Willis without major branch occlusion, significant proximal stenosis, aneurysm, or vascular malformation.  3. Widely patent cervical carotid and vertebral arteries.   Mr [name] [name] Wo Contrast Mr Mrv [name] [name] Wo Contrast 02/21/2019 IMPRESSION:  Known  right occipital hematoma with similar dimensions to prior head CT. An underlying cause is not seen, including mass, infarct, or venous thrombosis.   Transthoracic Echocardiogram   1. Left ventricular ejection fraction, by visual estimation, is 60 to 65%. The left ventricle has normal function. There is no left ventricular hypertrophy.  2. Global right ventricle has normal systolic function.The right ventricular size is normal. No increase in right ventricular wall thickness.  3. Left atrial size was normal.  4. Right atrial size was normal.  5. Mild mitral annular calcification.  6. The mitral valve is normal in structure. Trace mitral valve regurgitation. No evidence of mitral stenosis.  7. The tricuspid valve is normal in structure. Tricuspid valve regurgitation is mild.  8. The aortic valve is tricuspid. Aortic valve regurgitation is not visualized. Mild aortic valve sclerosis without stenosis.  9. The pulmonic valve was normal in structure. Pulmonic valve regurgitation is not visualized. 10. Normal pulmonary artery systolic pressure. 11. The inferior vena cava is normal in size with greater than 50% respiratory variability, suggesting right atrial pressure of 3 mmHg.   ECG - SR rate 83 BPM. (See cardiology reading for complete details)  PHYSICAL EXAM  Temp:  [97.9 F (36.6 C)-98.9 F (37.2 C)] 98.2 F (36.8 C) (11/29 0700) Pulse Rate:  [67-95] 95 (11/29 1000) Resp:  [12-23] 18 (11/29 1000) BP: (110-125)/(73-93) 123/86 (11/29 1000) SpO2:  [96 %-100 %] 99 % (11/29 1000) Weight:  [75.4 kg] 75.4 kg (11/28 1317)  General - Well nourished, well developed, in no apparent distress.  Ophthalmologic - fundi not visualized due to noncooperation.  Cardiovascular - Regular rhythm and rate.  Mental Status -  Level of arousal and orientation to time, place, and person were intact. Language including expression, naming, repetition, comprehension was assessed and found intact. Attention  span and concentration were normal. Fund of Knowledge was assessed and was intact.  Cranial Nerves II - XII - II - Visual field intact OU, no hemianopia, but left visual field blurry with shadows on objects, decreased acuity comparing with right. III, IV, VI - Extraocular movements intact. V - Facial sensation intact bilaterally. VII - Facial movement intact bilaterally. VIII - Hearing & vestibular intact bilaterally. X - Palate elevates symmetrically. XI - Chin turning & shoulder shrug intact bilaterally. XII - Tongue protrusion intact.  Motor Strength - The patient's strength was normal in all extremities and pronator drift was absent.  Bulk was normal and fasciculations were absent.   Motor Tone - Muscle tone was assessed at the neck and appendages and was normal.  Reflexes - The patient's reflexes were symmetrical in all extremities and she had no pathological reflexes.  Sensory - Light touch, temperature/pinprick were assessed and were symmetrical.    Coordination - The patient had normal movements in the hands and feet with no ataxia or dysmetria.  Tremor was absent.  Gait and Station - deferred.    ASSESSMENT/PLAN [name] is a 43 y.o. female with history of migraine headaches and depression presenting to [facility] with C/o of vision loss and HA. She did not receive IV t-PA due to Energy.  ICH: small acute right occipital ICH. Cause unknown, DDx include small AVM, RCVS, CVT or hemorrhagic infarct.   CT head 2.9 cm acute right occipital intraparenchymal hemorrhage.  MRI W & WO - Known right occipital hematoma with similar dimensions to prior head CT. An underlying cause is not seen, including mass, infarct, or venous thrombosis.   MRV - neg for CVT  CTA H&N - unremarkable, no AVM or aneurysm or vasospasm  2D Echo EF 60-65%  Will schedule cerebral angiogram tomorrow with Dr. Bonna Gains [name] 2 - negative  LDL - 112  HgbA1c 4.8  UDS -  negative  VTE prophylaxis - SCDs  No antithrombotic prior to admission, now on No antithrombotic  Therapy recommendations:  pending  Disposition:  Pending - no driving yet until cleared by ophthalmology as outpt  BP management  Home BP meds: none   Current BP meds: Labetalol prn  Stable . SBP goal < 140 mm Hg . Long-term BP goal normotensive  Hyperlipidemia  Home Lipid lowering medication: none   LDL 112, goal < 70  Current lipid lowering medication: none (statin contraindicated with ICH)  Continue statin at discharge  Migraines/tension HA  Hx of HA but mild  Not specific in one spot  excedrin PRN PTA  Other Stroke Risk Factors  OCP use  Other Active Problems  Mild Leukocytosis - 10.9 (afebrile)   Hospital day # 1  This patient is critically ill due to [place] and at significant risk of neurological worsening, death form hematoma  expansion, brain edema and herniation. This patient's care requires constant monitoring of vital signs, hemodynamics, respiratory and cardiac monitoring, review of multiple databases, neurological assessment, discussion with family, other specialists and medical decision making of high complexity. I spent 35 minutes of neurocritical care time in the care of this patient. I had long discussion with pt and husband at bedside, updated pt current condition, treatment plan and potential prognosis, and answered all the questions. They expressed understanding and appreciation.   Rosalin Hawking, MD PhD Stroke Neurology 02/22/2019 11:59 AM    To contact Stroke Continuity provider, please refer to [email or web address] After hours, contact General Neurology

## 2019-02-22 NOTE — Progress Notes (Signed)
Chief Complaint: Patient was seen in consultation today for cerebral angiogram at the request of Dr. Rosalin Hawking  Referring Physician(s):  Dr. Rosalin Hawking  Supervising Physician: Luanne Bras  Patient Status: [facility] - In-pt  History of Present Illness: [name] [name] is a 43 y.o. female admitted with headache and found to have acute right occipital intraparenchymal hemorrhage. She is now stable and improving. NIR is asked to perform diagnostic cerebral arteriogram. PMHx, meds, labs, imaging, allergies reviewed. Family at bedside.    Past Medical History:  Diagnosis Date  . ICH (intracerebral hemorrhage) ([place]) 02/21/2019    Past Surgical History:  Procedure Laterality Date  . CESAREAN SECTION      Allergies: Other  Medications:  Current Facility-Administered Medications:  .  [START ON 02/23/2019] 0.9 %  sodium chloride infusion, , Intravenous, Continuous, Rosalin Hawking, MD .  acetaminophen (TYLENOL) tablet 650 mg, 650 mg, Oral, Q4H PRN, 650 mg at 02/22/19 1231 **OR** acetaminophen (TYLENOL) 160 MG/5ML solution 650 mg, 650 mg, Per Tube, Q4H PRN **OR** acetaminophen (TYLENOL) suppository 650 mg, 650 mg, Rectal, Q4H PRN, Vonzella Nipple, NP .  Chlorhexidine Gluconate Cloth 2 % PADS 6 each, 6 each, Topical, Daily, Greta Doom, MD, 6 each at 02/22/19 1217 .  labetalol (NORMODYNE) injection 10 mg, 10 mg, Intravenous, Q10 min PRN, Vonzella Nipple, NP .  ondansetron [facility]) injection 4 mg, 4 mg, Intravenous, Q6H PRN, Aroor, Lanice Schwab, MD, 4 mg at 02/21/19 2059 .  pantoprazole (PROTONIX) EC tablet 40 mg, 40 mg, Oral, Daily, Rosalin Hawking, MD, 40 mg at 02/22/19 1229 .  senna-docusate (Senokot-S) tablet 1 tablet, 1 tablet, Oral, BID, Vonzella Nipple, NP, 1 tablet at 02/22/19 0909 .  sodium chloride (OCEAN) 0.65 % nasal spray 1 spray, 1 spray, Each Nare, PRN, Greta Doom, MD, 1 spray at 02/21/19 2235    Family History  Problem Relation Age  of Onset  . Hypertension Mother   . Hypertension Father   . Diabetes Father     Social History   Socioeconomic History  . Marital status: Married    Spouse name: Not on file  . Number of children: Not on file  . Years of education: Not on file  . Highest education level: Not on file  Occupational History  . Not on file  Social Needs  . Financial resource strain: Not on file  . Food insecurity    Worry: Not on file    Inability: Not on file  . Transportation needs    Medical: Not on file    Non-medical: Not on file  Tobacco Use  . Smoking status: Never Smoker  . Smokeless tobacco: Never Used  Substance and Sexual Activity  . Alcohol use: Never    Frequency: Never  . Drug use: Never  . Sexual activity: Not on file  Lifestyle  . Physical activity    Days per week: Not on file    Minutes per session: Not on file  . Stress: Not on file  Relationships  . Social Herbalist on phone: Not on file    Gets together: Not on file    Attends religious service: Not on file    Active member of club or organization: Not on file    Attends meetings of clubs or organizations: Not on file    Relationship status: Not on file  Other Topics Concern  . Not on file  Social History Narrative  . Not on file  Review of Systems: A 12 point ROS discussed and pertinent positives are indicated in the HPI above.  All other systems are negative.  Review of Systems  Vital Signs: BP 123/86   Pulse 95   Temp 98.6 F (37 C) (Oral)   Resp 18   Ht 5\' 7"  (1.702 m)   Wt 75.4 kg   LMP 02/14/2019   SpO2 99%   BMI 26.03 kg/m   Physical Exam Constitutional:      Appearance: Normal appearance. She is not ill-appearing.  HENT:     Mouth/Throat:     Mouth: Mucous membranes are moist.     Pharynx: Oropharynx is clear.  Cardiovascular:     Rate and Rhythm: Normal rate and regular rhythm.     Pulses: Normal pulses.     Heart sounds: Normal heart sounds.  Pulmonary:      Effort: Pulmonary effort is normal. No respiratory distress.     Breath sounds: Normal breath sounds.  Abdominal:     General: Abdomen is flat. There is no distension.     Palpations: Abdomen is soft.     Tenderness: There is no abdominal tenderness.  Skin:    General: Skin is warm and dry.  Neurological:     Mental Status: She is alert and oriented to person, place, and time.  Psychiatric:        Mood and Affect: Mood normal.        Thought Content: Thought content normal.        Judgment: Judgment normal.       Imaging: Ct Angio Head W Or Wo Contrast  Result Date: 02/21/2019 CLINICAL DATA:  Severe right-sided headache with left hemianopsia. EXAM: CT ANGIOGRAPHY HEAD AND NECK TECHNIQUE: Multidetector CT imaging of the head and neck was performed using the standard protocol during bolus administration of intravenous contrast. Multiplanar CT image reconstructions and MIPs were obtained to evaluate the vascular anatomy. Carotid stenosis measurements (when applicable) are obtained utilizing NASCET criteria, using the distal internal carotid diameter as the denominator. CONTRAST:  71mL OMNIPAQUE IOHEXOL 350 MG/ML SOLN COMPARISON:  None. FINDINGS: CT HEAD FINDINGS Brain: An acute intraparenchymal hemorrhage in the posterolateral right occipital lobe measures 2.9 x 1.8 x 1.9 cm (approximate volume of 5.0 mL). There is mild surrounding edema without significant mass effect. Separate from this hemorrhage, no acute infarct is evident. There is no midline shift or extra-axial fluid collection. The ventricles are normal in size. Vascular: Reported below. Skull: No fracture or focal osseous lesion. Sinuses: Visualized paranasal sinuses and mastoid air cells are clear. Orbits: Unremarkable. Review of the MIP images confirms the above findings CTA NECK FINDINGS Aortic arch: Standard 3 vessel aortic arch with widely patent arch vessel origins. Right carotid system: Patent and smooth without evidence of  stenosis or dissection. Left carotid system: Patent and smooth without evidence of stenosis or dissection. Vertebral arteries: Patent and smooth without evidence of stenosis or dissection. Codominant. Skeleton: Mini plate and screw fixation of the maxilla and mandible. Other neck: No evidence of cervical lymphadenopathy or mass. Upper chest: Clear lung apices. Review of the MIP images confirms the above findings CTA HEAD FINDINGS Anterior circulation: The internal carotid arteries are widely patent from skull base to carotid termini. ACAs and MCAs are patent without evidence of proximal branch occlusion or significant proximal stenosis. The right A1 segment is diminutive or absent. No aneurysm or vascular malformation is identified. Posterior circulation: The intracranial vertebral arteries are widely patent to the basilar.  Patent left PICA, right AICA, and bilateral SCA origins are identified. The basilar artery is widely patent. There are right larger than left posterior communicating arteries with hypoplasia of the right P1 segment. Both PCAs are patent without evidence of significant proximal stenosis. No aneurysm or vascular malformation is identified with special attention to the right occipital hemorrhage. Venous sinuses: Patent. Anatomic variants: Diminutive or absent right A1 segment. Review of the MIP images confirms the above findings IMPRESSION: 1. 2.9 cm acute right occipital intraparenchymal hemorrhage. 2. Patent Circle of Willis without major branch occlusion, significant proximal stenosis, aneurysm, or vascular malformation. 3. Widely patent cervical carotid and vertebral arteries. Critical Value/emergent results were called by telephone at the time of interpretation on 02/21/2019 at 11:11 am to provider Carlisle Cater, who verbally acknowledged these results. Electronically Signed   By: Logan Bores M.D.   On: 02/21/2019 11:13   Ct Angio Neck W And/or Wo Contrast  Result Date:  02/21/2019 CLINICAL DATA:  Severe right-sided headache with left hemianopsia. EXAM: CT ANGIOGRAPHY HEAD AND NECK TECHNIQUE: Multidetector CT imaging of the head and neck was performed using the standard protocol during bolus administration of intravenous contrast. Multiplanar CT image reconstructions and MIPs were obtained to evaluate the vascular anatomy. Carotid stenosis measurements (when applicable) are obtained utilizing NASCET criteria, using the distal internal carotid diameter as the denominator. CONTRAST:  40mL OMNIPAQUE IOHEXOL 350 MG/ML SOLN COMPARISON:  None. FINDINGS: CT HEAD FINDINGS Brain: An acute intraparenchymal hemorrhage in the posterolateral right occipital lobe measures 2.9 x 1.8 x 1.9 cm (approximate volume of 5.0 mL). There is mild surrounding edema without significant mass effect. Separate from this hemorrhage, no acute infarct is evident. There is no midline shift or extra-axial fluid collection. The ventricles are normal in size. Vascular: Reported below. Skull: No fracture or focal osseous lesion. Sinuses: Visualized paranasal sinuses and mastoid air cells are clear. Orbits: Unremarkable. Review of the MIP images confirms the above findings CTA NECK FINDINGS Aortic arch: Standard 3 vessel aortic arch with widely patent arch vessel origins. Right carotid system: Patent and smooth without evidence of stenosis or dissection. Left carotid system: Patent and smooth without evidence of stenosis or dissection. Vertebral arteries: Patent and smooth without evidence of stenosis or dissection. Codominant. Skeleton: Mini plate and screw fixation of the maxilla and mandible. Other neck: No evidence of cervical lymphadenopathy or mass. Upper chest: Clear lung apices. Review of the MIP images confirms the above findings CTA HEAD FINDINGS Anterior circulation: The internal carotid arteries are widely patent from skull base to carotid termini. ACAs and MCAs are patent without evidence of proximal branch  occlusion or significant proximal stenosis. The right A1 segment is diminutive or absent. No aneurysm or vascular malformation is identified. Posterior circulation: The intracranial vertebral arteries are widely patent to the basilar. Patent left PICA, right AICA, and bilateral SCA origins are identified. The basilar artery is widely patent. There are right larger than left posterior communicating arteries with hypoplasia of the right P1 segment. Both PCAs are patent without evidence of significant proximal stenosis. No aneurysm or vascular malformation is identified with special attention to the right occipital hemorrhage. Venous sinuses: Patent. Anatomic variants: Diminutive or absent right A1 segment. Review of the MIP images confirms the above findings IMPRESSION: 1. 2.9 cm acute right occipital intraparenchymal hemorrhage. 2. Patent Circle of Willis without major branch occlusion, significant proximal stenosis, aneurysm, or vascular malformation. 3. Widely patent cervical carotid and vertebral arteries. Critical Value/emergent results were called by telephone  at the time of interpretation on 02/21/2019 at 11:11 am to provider Carlisle Cater, who verbally acknowledged these results. Electronically Signed   By: Logan Bores M.D.   On: 02/21/2019 11:13   Mr Jeri Cos [id number] Contrast  Result Date: 02/21/2019 CLINICAL DATA:  Severe right sided headache with hemianopia. EXAM: MRI HEAD WITHOUT AND WITH CONTRAST MRV HEAD WITHOUT AND WITH Normal flow voids, arterial structures better seen on prior CTA. CONTRAST TECHNIQUE: Multiplanar, multiecho pulse sequences of the brain and surrounding structures were obtained without and with intravenous contrast. Angiographic images of the intracranial venous structures were obtained using MRV technique without and with intravenous contrast. CONTRAST:  12mL GADAVIST GADOBUTROL 1 MMOL/ML IV SOLN COMPARISON:  CTA head neck from earlier today FINDINGS: Brain MRI: Approximately 3 cm ovoid  hematoma in the right occipital lobe with rim of edema, dimensions similar to prior when allowing for differences in modality and angling. No other site of acute or chronic blood products. No evidence of underlying infarct, mass, or generalized trauma. Best seen on postcontrast MRV there are a few small superficial vessels about the hematoma but these appear to be normal vessels within compressed sulci. No clear vascular tangle or enlarged vessels to implicate fistula or AVM. MRV: Dominant left transverse sigmoid system which receives flow from the superior sagittal sinus. Mildly hypoplastic right transverse sigmoid system receives flow from the straight sinus. Paired deep veins are patent. No dural venous sinus thrombosis. No detected cortical vein thrombosis. IMPRESSION: Known right occipital hematoma with similar dimensions to prior head CT. An underlying cause is not seen, including mass, infarct, or venous thrombosis. Electronically Signed   By: Monte Fantasia M.D.   On: 02/21/2019 18:29   Mr Mrv Miguel Dibble [id number] Contrast  Result Date: 02/21/2019 CLINICAL DATA:  Severe right sided headache with hemianopia. EXAM: MRI HEAD WITHOUT AND WITH CONTRAST MRV HEAD WITHOUT AND WITH Normal flow voids, arterial structures better seen on prior CTA. CONTRAST TECHNIQUE: Multiplanar, multiecho pulse sequences of the brain and surrounding structures were obtained without and with intravenous contrast. Angiographic images of the intracranial venous structures were obtained using MRV technique without and with intravenous contrast. CONTRAST:  40mL GADAVIST GADOBUTROL 1 MMOL/ML IV SOLN COMPARISON:  CTA head neck from earlier today FINDINGS: Brain MRI: Approximately 3 cm ovoid hematoma in the right occipital lobe with rim of edema, dimensions similar to prior when allowing for differences in modality and angling. No other site of acute or chronic blood products. No evidence of underlying infarct, mass, or generalized trauma. Best  seen on postcontrast MRV there are a few small superficial vessels about the hematoma but these appear to be normal vessels within compressed sulci. No clear vascular tangle or enlarged vessels to implicate fistula or AVM. MRV: Dominant left transverse sigmoid system which receives flow from the superior sagittal sinus. Mildly hypoplastic right transverse sigmoid system receives flow from the straight sinus. Paired deep veins are patent. No dural venous sinus thrombosis. No detected cortical vein thrombosis. IMPRESSION: Known right occipital hematoma with similar dimensions to prior head CT. An underlying cause is not seen, including mass, infarct, or venous thrombosis. Electronically Signed   By: Monte Fantasia M.D.   On: 02/21/2019 18:29    Labs:  CBC: Recent Labs    02/21/19 1003 02/22/19 0400  WBC 7.1 10.9*  HGB 14.3 14.1  HCT 43.8 41.9  PLT 248 266    COAGS: Recent Labs    02/21/19 1003  INR 1.0  APTT 27  BMP: Recent Labs    02/21/19 1003 02/22/19 0400  NA 137 138  K 3.5 3.8  CL 105 105  CO2 24 23  GLUCOSE 108* 109*  BUN 13 11  CALCIUM 8.9 8.8*  CREATININE 0.72 0.72  GFRNONAA >60 >60  GFRAA >60 >60    LIVER FUNCTION TESTS: Recent Labs    02/21/19 1003  BILITOT 0.5  AST 20  ALT 21  ALKPHOS 49  PROT 7.5  ALBUMIN 3.9    TUMOR MARKERS: No results for input(s): AFPTM, CEA, CA199, CHROMGRNA in the last 8760 hours.  Assessment and Plan: Right occipital intraparenchymal hemorrhage For cerebral arteriogram, tentatively tomorrow. NPO p MN Labs ok Risks and benefits of cerebral arteriogram were discussed with the patient including, but not limited to bleeding, infection, vascular injury or contrast induced renal failure.  This interventional procedure involves the use of X-rays and because of the nature of the planned procedure, it is possible that we will have prolonged use of X-ray fluoroscopy.  Potential radiation risks to you include (but are not  limited to) the following: - A slightly elevated risk for cancer  several years later in life. This risk is typically less than 0.5% percent. This risk is low in comparison to the normal incidence of human cancer, which is 33% for women and 50% for men according to the [place]. - Radiation induced injury can include skin redness, resembling a rash, tissue breakdown / ulcers and hair loss (which can be temporary or permanent).   The likelihood of either of these occurring depends on the difficulty of the procedure and whether you are sensitive to radiation due to previous procedures, disease, or genetic conditions.   IF your procedure requires a prolonged use of radiation, you will be notified and given written instructions for further action.  It is your responsibility to monitor the irradiated area for the 2 weeks following the procedure and to notify your physician if you are concerned that you have suffered a radiation induced injury.    All of the patient's questions were answered, patient is agreeable to proceed.  Consent signed and in chart.    Thank you for this interesting consult.  I greatly enjoyed meeting Margaretha P Arvidson and look forward to participating in their care.  A copy of this report was sent to the requesting provider on this date.  Electronically Signed: Ascencion Dike, PA-C 02/22/2019, 2:25 PM   I spent a total of 20 minutes in face to face in clinical consultation, greater than 50% of which was counseling/coordinating care for cerebral arteriogram

## 2019-02-22 NOTE — Evaluation (Signed)
Occupational Therapy Evaluation Patient Details Name: [name] [name] MRN: [id number] DOB: [birth year] Today's Date: 02/22/2019    History of Present Illness 43 yo admitted with HA and left visual field loss with CT demonstrating right occipital intraparenchymal hemorrhage. Only significant history is birth control use   Clinical Impression   Pt admitted with above. She demonstrates the below listed deficits and will benefit from continued OT to maximize safety and independence with BADLs.  Pt presents to OT with visual deficits.  She currently requires supervision for ADLs, and is mod I with functional mobility.  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   Recommend follow OP OT, follow up with neuro ophthalmology, and no driving until cleared.  Will follow acutely.       Follow Up Recommendations  Outpatient OT;Supervision - Intermittent    Equipment Recommendations  None recommended by OT    Recommendations for Other Services       Precautions / Restrictions Precautions Precautions: None      Mobility Bed Mobility Overal bed mobility: Independent                Transfers Overall transfer level: Independent                    Balance Overall balance assessment: No apparent balance deficits (not formally assessed)   Sitting balance-Leahy Scale: Normal       Standing balance-Leahy Scale: Normal   Single Leg Stance - Right Leg: 15 Single Leg Stance - Left Leg: 15 Tandem Stance - Right Leg: 30 Tandem Stance - Left Leg: 30 Rhomberg - Eyes Opened: 60 Rhomberg - Eyes Closed: 30   High Level Balance Comments: pt with initial unsteadiness with left tandem stance with pt able to recover without assist and resume tandem on 2nd trial and hold Standardized Balance Assessment Standardized Balance Assessment : Berg Balance Test;Dynamic Gait Index Berg Balance Test Sit to Stand: Able to stand without using hands and stabilize  independently Standing Unsupported: Able to stand safely 2 minutes Sitting with Back Unsupported but Feet Supported on Floor or Stool: Able to sit safely and securely 2 minutes Stand to Sit: Sits safely with minimal use of hands Transfers: Able to transfer safely, minor use of hands Standing Unsupported with Eyes Closed: Able to stand 10 seconds safely Standing Ubsupported with Feet Together: Able to place feet together independently and stand 1 minute safely From Standing, Reach Forward with Outstretched Arm: Can reach confidently >25 cm (10") From Standing Position, Pick up Object from Floor: Able to pick up shoe safely and easily From Standing Position, Turn to Look Behind Over each Shoulder: Looks behind from both sides and weight shifts well Turn 360 Degrees: Able to turn 360 degrees safely in 4 seconds or less Standing Unsupported, Alternately Place Feet on Step/Stool: Able to stand independently and safely and complete 8 steps in 20 seconds Standing Unsupported, One Foot in Front: Able to take small step independently and hold 30 seconds Standing on One Leg: Able to lift leg independently and hold > 10 seconds Total Score: 54 Dynamic Gait Index Level Surface: Normal Change in Gait Speed: Normal Gait with Horizontal Head Turns: Normal Gait with Vertical Head Turns: Normal Gait and Pivot Turn: Normal Step Over Obstacle: Normal Step Around Obstacles: Normal Steps: Normal Total Score: 24     ADL either performed or assessed with clinical judgement   ADL Overall ADL's : Needs assistance/impaired Eating/Feeding: Modified independent;Sitting  Grooming: Wash/dry hands;Wash/dry face;Oral care;Brushing hair;Supervision/safety;Standing   Upper Body Bathing: Set up;Sitting   Lower Body Bathing: Supervison/ safety;Sit to/from stand   Upper Body Dressing : Set up;Sitting   Lower Body Dressing: Supervision/safety;Sit to/from stand   Toilet Transfer:  Supervision/safety;Ambulation;Comfort height toilet   Toileting- Clothing Manipulation and Hygiene: Supervision/safety;Sit to/from stand       Functional mobility during ADLs: Supervision/safety       Vision Baseline Vision/History: No visual deficits Patient Visual Report: Blurring of vision;Central vision impairment;Peripheral vision impairment Vision Assessment?: Yes Eye Alignment: Within Functional Limits Ocular Range of Motion: Within Functional Limits Alignment/Gaze Preference: Within Defined Limits Additional Comments: Pt reports difficulty seeing the first several letters of words, the Lt side of the clock face, and that the left side of her face looks strange.  She reoprts possible subjective improvement since yesterday.  Confrontation testing did not yield field deficit.  SHe reports increase in HA pain with visual activities.        Perception Perception Perception Tested?: Yes   Praxis Praxis Praxis tested?: Within functional limits    Pertinent Vitals/Pain Pain Assessment: Faces Pain Score: 4  Faces Pain Scale: Hurts little more Pain Location: HA right Pain Descriptors / Indicators: Aching Pain Intervention(s): Monitored during session     Hand Dominance Right   Extremity/Trunk Assessment Upper Extremity Assessment Upper Extremity Assessment: Overall WFL for tasks assessed   Lower Extremity Assessment Lower Extremity Assessment: Overall WFL for tasks assessed   Cervical / Trunk Assessment Cervical / Trunk Assessment: Normal   Communication Communication Communication: No difficulties   Cognition Arousal/Alertness: Awake/alert Behavior During Therapy: WFL for tasks assessed/performed Overall Cognitive Status: Within Functional Limits for tasks assessed                                     General Comments  discussed scanning strategies with pt.  Also discussed need to follow up with neuro ophthalmology at discharge and need for  [facility] field assessment.       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        OT Problem List: Decreased safety awareness;Impaired vision/perception;Pain      OT Treatment/Interventions: Self-care/ADL training;DME and/or AE instruction;Therapeutic activities;Visual/perceptual remediation/compensation;Patient/family education    OT Goals(Current goals can be found in the care plan section) Acute Rehab OT Goals Patient Stated Goal: to get my vision back  OT Goal Formulation: With patient Time For Goal Achievement: 03/08/19 Potential to Achieve Goals: Good ADL Goals Additional ADL Goal #1: Pt will be independent with compensatory strategies for vision Additional ADL Goal #2: Pt will locate needed items mod I in a busy, unstructured environment  OT Frequency: Min 2X/week   Barriers to D/C:            Co-evaluation  AM-PAC OT "6 Clicks" Daily Activity     Outcome Measure Help from another person eating meals?: None Help from another person taking care of personal grooming?: A Little Help from another person toileting, which includes using toliet, bedpan, or urinal?: None Help from another person bathing (including washing, rinsing, drying)?: A Little Help from another person to put on and taking off regular upper body clothing?: A Little Help from another person to put on and taking off regular lower body clothing?: A Little 6 Click Score: 20   End  of Session Nurse Communication: Mobility status  Activity Tolerance: Patient tolerated treatment well Patient left: in bed;with call bell/phone within reach;with family/visitor present  OT Visit Diagnosis: Low vision, both eyes (H54.2)                Time: [id number] OT Time Calculation (min): 35 min Charges:  OT General Charges $OT Visit: 1 Visit OT Evaluation $OT Eval Moderate Complexity: 1 Mod OT Treatments $Therapeutic Activity: 8-22 mins  Lucille Passy, OTR/L Acute Rehabilitation Services Pager [phone] Office [phone]   Lucille Passy M 02/22/2019, 2:20 PM

## 2019-02-22 NOTE — Progress Notes (Signed)
  Echocardiogram 2D Echocardiogram has been performed.  [name] [name] 02/22/2019, 8:59 AM

## 2019-02-23 ENCOUNTER — Inpatient Hospital Stay (HOSPITAL_COMMUNITY): Payer: BC Managed Care – PPO

## 2019-02-23 DIAGNOSIS — I611 Nontraumatic intracerebral hemorrhage in hemisphere, cortical: Secondary | ICD-10-CM | POA: Diagnosis not present

## 2019-02-23 HISTORY — PX: IR ANGIO INTRA EXTRACRAN SEL COM CAROTID INNOMINATE BILAT MOD SED: IMG5360

## 2019-02-23 HISTORY — PX: IR ANGIO VERTEBRAL SEL VERTEBRAL BILAT MOD SED: IMG5369

## 2019-02-23 LAB — CBC
HCT: 41.9 % (ref 36.0–46.0)
Hemoglobin: 14.1 g/dL (ref 12.0–15.0)
MCH: 29.8 pg (ref 26.0–34.0)
MCHC: 33.7 g/dL (ref 30.0–36.0)
MCV: 88.6 fL (ref 80.0–100.0)
Platelets: 245 10*3/uL (ref 150–400)
RBC: 4.73 MIL/uL (ref 3.87–5.11)
RDW: 13.5 % (ref 11.5–15.5)
WBC: 8 10*3/uL (ref 4.0–10.5)
nRBC: 0 % (ref 0.0–0.2)

## 2019-02-23 LAB — BASIC METABOLIC PANEL
Anion gap: 9 (ref 5–15)
BUN: 12 mg/dL (ref 6–20)
CO2: 26 mmol/L (ref 22–32)
Calcium: 8.8 mg/dL — ABNORMAL LOW (ref 8.9–10.3)
Chloride: 102 mmol/L (ref 98–111)
Creatinine, Ser: 0.82 mg/dL (ref 0.44–1.00)
GFR calc Af Amer: >60 mL/min (ref >60)
GFR calc non Af Amer: >60 mL/min (ref >60)
Glucose, Bld: 96 mg/dL (ref 70–99)
Potassium: 3.9 mmol/L (ref 3.5–5.1)
Sodium: 137 mmol/L (ref 135–145)

## 2019-02-23 MED ORDER — SODIUM CHLORIDE 0.9 % IV SOLN
INTRAVENOUS | Status: DC | PRN
  Administered 2019-02-23: 10 mL/h via INTRAVENOUS

## 2019-02-23 MED ORDER — LIDOCAINE HCL 1 % IJ SOLN
INTRAMUSCULAR | Status: DC | PRN
  Administered 2019-02-23: 15 mL

## 2019-02-23 MED ORDER — FENTANYL CITRATE (PF) 100 MCG/2ML IJ SOLN
INTRAMUSCULAR | Status: AC
  Filled 2019-02-23: qty 2

## 2019-02-23 MED ORDER — IOHEXOL 300 MG/ML  SOLN
150.0000 mL | Freq: Once | INTRAMUSCULAR | Status: AC | PRN
  Administered 2019-02-23: 70 mL via INTRA_ARTERIAL

## 2019-02-23 MED ORDER — LIDOCAINE HCL 1 % IJ SOLN
INTRAMUSCULAR | Status: AC
  Filled 2019-02-23: qty 20

## 2019-02-23 MED ORDER — SODIUM CHLORIDE 0.9 % IV SOLN: INTRAVENOUS | Status: DC

## 2019-02-23 MED ORDER — FENTANYL CITRATE (PF) 100 MCG/2ML IJ SOLN
INTRAMUSCULAR | Status: DC | PRN
  Administered 2019-02-23 (×2): 25 ug via INTRAVENOUS

## 2019-02-23 MED ORDER — VERAPAMIL HCL 2.5 MG/ML IV SOLN
INTRAVENOUS | Status: AC
  Filled 2019-02-23: qty 2

## 2019-02-23 MED ORDER — HEPARIN SODIUM (PORCINE) 1000 UNIT/ML IJ SOLN
INTRAMUSCULAR | Status: DC | PRN
  Administered 2019-02-23: 500 [IU] via INTRAVENOUS

## 2019-02-23 MED ORDER — MIDAZOLAM HCL 2 MG/2ML IJ SOLN
INTRAMUSCULAR | Status: AC
  Filled 2019-02-23: qty 2

## 2019-02-23 MED ORDER — MIDAZOLAM HCL 2 MG/2ML IJ SOLN
INTRAMUSCULAR | Status: DC | PRN
  Administered 2019-02-23 (×2): 1 mg via INTRAVENOUS

## 2019-02-23 MED ORDER — HEPARIN SODIUM (PORCINE) 1000 UNIT/ML IJ SOLN
INTRAMUSCULAR | Status: AC
  Filled 2019-02-23: qty 1

## 2019-02-23 NOTE — Evaluation (Signed)
Speech Language Pathology Evaluation Patient Details Name: [name] [name] MRN: [id number] DOB: Aug 10, 1975 Today's Date: 02/23/2019 Time: 1355-1410 SLP Time Calculation (min) (ACUTE ONLY): 15 min  Problem List:  Patient Active Problem List   Diagnosis Date Noted  . Hemorrhagic stroke ([place]) 02/21/2019  . ICH (intracerebral hemorrhage) ([place]) 02/21/2019   Past Medical History:  Past Medical History:  Diagnosis Date  . ICH (intracerebral hemorrhage) ([place]) 02/21/2019   Past Surgical History:  Past Surgical History:  Procedure Laterality Date  . CESAREAN SECTION     HPI:  Pt is a 43 yo female admitted with HA and left visual field loss with CT demonstrating right occipital intraparenchymal hemorrhage. Only significant history is birth control use.    Assessment / Plan / Recommendation Clinical Impression  Pt was seen for a cognitive-linguistic evaluation in the setting of a right occipital intraparenchymal hemorrhage.  Pt was encountered awake/alert with husband present at bedside.  Pt reported that she lives with her husband and two sons and that she is responsible for all ADLs and IADLs at baseline.  She completed portions of the Cognistat in addition to informal measures and she presents with mild short-term memory deficits.  All other cognitive functions were WNL.  Pt's expressive and receptive language abilities were functional and no dysarthria was observed.  Pt was educated regarding short-term memory strategies and she verbalized understanding.  Pt may benefit from supervision with IADLs (medications, finances, etc.) when she is first discharged.  No further skilled ST is warranted at this time.  Please re-consult if additional needs arise.      SLP Assessment  SLP Recommendation/Assessment: Patient does not need any further Speech Lanaguage Pathology Services SLP Visit Diagnosis: Cognitive communication deficit (R41.841)    Follow Up Recommendations  None    Frequency and  Duration           SLP Evaluation Cognition  Overall Cognitive Status: Impaired/Different from baseline Arousal/Alertness: Awake/alert Orientation Level: Oriented X4 Attention: Sustained;Focused Focused Attention: Appears intact Sustained Attention: Appears intact Memory: Impaired Memory Impairment: Retrieval deficit;Decreased short term memory Decreased Short Term Memory: Verbal complex Awareness: Appears intact Problem Solving: Appears intact Executive Function: Reasoning;Organizing Reasoning: Appears intact Organizing: Appears intact Safety/Judgment: Appears intact       Comprehension  Auditory Comprehension Overall Auditory Comprehension: Appears within functional limits for tasks assessed    Expression Expression Primary Mode of Expression: Verbal Verbal Expression Overall Verbal Expression: Appears within functional limits for tasks assessed Written Expression Dominant Hand: Right   Oral / Motor  Oral Motor/Sensory Function Overall Oral Motor/Sensory Function: Within functional limits Motor Speech Overall Motor Speech: Appears within functional limits for tasks assessed   GO                   Colin Mulders M.S., [place] Acute Rehabilitation Services Office: [phone]  [place] 02/23/2019, 2:19 PM

## 2019-02-23 NOTE — Progress Notes (Signed)
STROKE TEAM PROGRESS NOTE   INTERVAL HISTORY Pt lying in bed, no acute change over night. No complains. BP stable. Pending angiogram today.    OBJECTIVE Vitals:   02/22/19 2337 02/23/19 0000 02/23/19 0400 02/23/19 0900  BP:  113/71 114/77 120/84  Pulse:  61 68 70  Resp:  17  10  Temp: 98.3 F (36.8 C)     TempSrc: Oral     SpO2:  97% 99% 99%  Weight:      Height:        CBC:  Recent Labs  Lab 02/21/19 1003 02/22/19 0400 02/23/19 0345  WBC 7.1 10.9* 8.0  NEUTROABS 4.8  --   --   HGB 14.3 14.1 14.1  HCT 43.8 41.9 41.9  MCV 89.8 87.7 88.6  PLT 248 266 [id number]    Basic Metabolic Panel:  Recent Labs  Lab 02/22/19 0400 02/23/19 0345  NA 138 137  K 3.8 3.9  CL 105 102  CO2 23 26  GLUCOSE 109* 96  BUN 11 12  CREATININE 0.72 0.82  CALCIUM 8.8* 8.8*    Lipid Panel:     Component Value Date/Time   CHOL 180 02/22/2019 0400   TRIG 124 02/22/2019 0400   HDL 43 02/22/2019 0400   CHOLHDL 4.2 02/22/2019 0400   VLDL 25 02/22/2019 0400   LDLCALC 112 (H) 02/22/2019 0400   HgbA1c:  Lab Results  Component Value Date   HGBA1C 4.8 02/22/2019   Urine Drug Screen:     Component Value Date/Time   LABOPIA NONE DETECTED 02/21/2019 1119   COCAINSCRNUR NONE DETECTED 02/21/2019 1119   LABBENZ NONE DETECTED 02/21/2019 1119   AMPHETMU NONE DETECTED 02/21/2019 1119   THCU NONE DETECTED 02/21/2019 1119   LABBARB NONE DETECTED 02/21/2019 1119    Alcohol Level     Component Value Date/Time   ETH <10 02/21/2019 1003    IMAGING  Ct Angio Head W Or Wo Contrast Ct Angio Neck W And/or Wo Contrast 02/21/2019 1. 2.9 cm acute right occipital intraparenchymal hemorrhage.  2. Patent Circle of Willis without major branch occlusion, significant proximal stenosis, aneurysm, or vascular malformation.  3. Widely patent cervical carotid and vertebral arteries.   Mr [name] [name] Wo Contrast Mr Mrv [name] [name] Wo Contrast 02/21/2019 Known right occipital hematoma with similar dimensions to prior  head CT. An underlying cause is not seen, including mass, infarct, or venous thrombosis.   Transthoracic Echocardiogram   1. Left ventricular ejection fraction, by visual estimation, is 60 to 65%. The left ventricle has normal function. There is no left ventricular hypertrophy.  2. Global right ventricle has normal systolic function.The right ventricular size is normal. No increase in right ventricular wall thickness.  3. Left atrial size was normal.  4. Right atrial size was normal.  5. Mild mitral annular calcification.  6. The mitral valve is normal in structure. Trace mitral valve regurgitation. No evidence of mitral stenosis.  7. The tricuspid valve is normal in structure. Tricuspid valve regurgitation is mild.  8. The aortic valve is tricuspid. Aortic valve regurgitation is not visualized. Mild aortic valve sclerosis without stenosis.  9. The pulmonic valve was normal in structure. Pulmonic valve regurgitation is not visualized. 10. Normal pulmonary artery systolic pressure. 11. The inferior vena cava is normal in size with greater than 50% respiratory variability, suggesting right atrial pressure of 3 mmHg.  ECG - SR rate 83 BPM. (See cardiology reading for complete details)   PHYSICAL EXAM  Temp:  [id number]  F (36.7 C)-98.8 F (37.1 C)] 98.3 F (36.8 C) (11/29 2337) Pulse Rate:  [61-95] 70 (11/30 0900) Resp:  [10-18] 10 (11/30 0900) BP: (113-123)/(68-86) 120/84 (11/30 0900) SpO2:  [97 %-99 %] 99 % (11/30 0900)  General - Well nourished, well developed, in no apparent distress.  Ophthalmologic - fundi not visualized due to noncooperation.  Cardiovascular - Regular rhythm and rate.  Mental Status -  Level of arousal and orientation to time, place, and person were intact. Language including expression, naming, repetition, comprehension was assessed and found intact. Attention span and concentration were normal. Fund of Knowledge was assessed and was intact.  Cranial Nerves II  - XII - II - Visual field intact OU, no hemianopia, but left visual field blurry with shadows on objects, decreased acuity comparing with right. III, IV, VI - Extraocular movements intact. V - Facial sensation intact bilaterally. VII - Facial movement intact bilaterally. VIII - Hearing & vestibular intact bilaterally. X - Palate elevates symmetrically. XI - Chin turning & shoulder shrug intact bilaterally. XII - Tongue protrusion intact.  Motor Strength - The patient's strength was normal in all extremities and pronator drift was absent.  Bulk was normal and fasciculations were absent.   Motor Tone - Muscle tone was assessed at the neck and appendages and was normal.  Reflexes - The patient's reflexes were symmetrical in all extremities and she had no pathological reflexes.  Sensory - Light touch, temperature/pinprick were assessed and were symmetrical.    Coordination - The patient had normal movements in the hands and feet with no ataxia or dysmetria.  Tremor was absent.  Gait and Station - deferred.   ASSESSMENT/PLAN [name] [name] is a 43 y.o. female with history of migraine headaches and depression presenting to [facility] with C/o of vision loss and HA. She did not receive IV t-PA due to [place].  ICH: small acute right occipital ICH. Cause unknown, DDx include small AVM, RCVS, CVT or hemorrhagic infarct.   CT head 2.9 cm acute right occipital intraparenchymal hemorrhage.  MRI W & WO - Known right occipital hematoma with similar dimensions to prior head CT. An underlying cause is not seen, including mass, infarct, or venous thrombosis.   MRV - neg for CVT  CTA H&N - unremarkable, no AVM or aneurysm or vasospasm  2D Echo EF 60-65%  Cerebral angiogram pending   [name] [name] Virus 2 - negative  LDL - 112  HgbA1c 4.8  UDS - negative  VTE prophylaxis - SCDs  No antithrombotic prior to admission, now on No antithrombotic  Therapy recommendations:  OP OT, no PT    Disposition:  Pending - no driving yet until cleared by ophthalmology as outpt  BP management  Home BP meds: none, no hx HTN   Current BP meds: Labetalol prn  Stable . SBP goal < 140 mm Hg . Long-term BP goal normotensive  Hyperlipidemia  Home Lipid lowering medication: none   LDL 112, goal < 70  Current lipid lowering medication: none (statin contraindicated with ICH)  Continue statin at discharge  Migraines/tension HA  Hx of HA but mild  Not specific in one spot  excedrin PRN PTA  Other Stroke Risk Factors  OCP use  Other Active Problems  Mild Leukocytosis - 10.9 (afebrile)   Hospital day # 2   Rosalin Hawking, MD PhD Stroke Neurology 02/23/2019 9:54 AM    To contact Stroke Continuity provider, please refer to [email or web address] After hours, contact General Neurology

## 2019-02-23 NOTE — Progress Notes (Addendum)
Occupational Therapy Note  Pt with improving ability to identify numbers and letters on Lt.  She is able to perform visual scanning tasks accurately, but with increased time.  She was instructed in initial HEP for vision and demonstrates/verbalizes understanding   Recommend OPOT     02/23/19 1527  OT Visit Information  Last OT Received On 02/24/19  Assistance Needed +1  History of Present Illness 43 yo admitted with HA and left visual field loss with CT demonstrating right occipital intraparenchymal hemorrhage. Only significant history is birth control use  Precautions  Precautions None  Pain Assessment  Pain Assessment Faces  Faces Pain Scale 4  Pain Location headache   Pain Descriptors / Indicators Headache  Pain Intervention(s) Monitored during session  Cognition  Arousal/Alertness Awake/alert  Behavior During Therapy WFL for tasks assessed/performed  Overall Cognitive Status Within Functional Limits for tasks assessed  General Comments for tasks assessed   Upper Extremity Assessment  Upper Extremity Assessment Overall WFL for tasks assessed  Lower Extremity Assessment  Lower Extremity Assessment Overall WFL for tasks assessed  Vision- Assessment  Additional Comments Pt reports improvement with her ability to see letters and numbers on the Left.  She reports she is now able to make her face look "almost normal" if she focuses on each side individually   Exercises  Exercises Other exercises  Other Exercises  Other Exercises Pt performed letter cancellation tasks, number copying, and letter jumps with 98% accuracy, but requires increased amount of time.  Discussed compensatory strategies for activities.  She has been playing simple video games to improve visual scanning and reports it is more difficult than she anticipated.  Discussed stroke fatigue with pt   OT - End of Session  Activity Tolerance Patient tolerated treatment well  Patient left in chair;with call bell/phone  within reach;with family/visitor present  Nurse Communication Mobility status  OT Assessment/Plan  OT Plan Discharge plan remains appropriate  OT Visit Diagnosis Low vision, both eyes (H54.2)  OT Frequency (ACUTE ONLY) Min 2X/week  Follow Up Recommendations Outpatient OT;Supervision - Intermittent  OT Equipment None recommended by OT  AM-PAC OT "6 Clicks" Daily Activity Outcome Measure (Version 2)  Help from another person eating meals? 4  Help from another person taking care of personal grooming? 3  Help from another person toileting, which includes using toliet, bedpan, or urinal? 4  Help from another person bathing (including washing, rinsing, drying)? 3  Help from another person to put on and taking off regular upper body clothing? 3  Help from another person to put on and taking off regular lower body clothing? 3  6 Click Score 20  OT Goal Progression  Progress towards OT goals Progressing toward goals      02/23/19 1500  OT Time Calculation  OT Start Time (ACUTE ONLY) 1144  OT Stop Time (ACUTE ONLY) 1222  OT Time Calculation (min) 38 min  OT General Charges  $OT Visit 1 Visit  OT Treatments  $Therapeutic Activity 38-52 mins  Lucille Passy, OTR/L Acute Rehabilitation Services Pager [phone] Office [phone]

## 2019-02-23 NOTE — Procedures (Signed)
S/P 4 vessel cerebral arteriogram RT CFA approach. Findings. 1.Attenuated caliber of RT mid transverse sinus? Underlying stenosis.. Otherwise negative for aneurysm or ,AV shunt  Or AVM .   S.Christle Nolting MD

## 2019-02-24 DIAGNOSIS — I611 Nontraumatic intracerebral hemorrhage in hemisphere, cortical: Secondary | ICD-10-CM | POA: Diagnosis not present

## 2019-02-24 DIAGNOSIS — G43909 Migraine, unspecified, not intractable, without status migrainosus: Secondary | ICD-10-CM | POA: Diagnosis present

## 2019-02-24 DIAGNOSIS — G43009 Migraine without aura, not intractable, without status migrainosus: Secondary | ICD-10-CM | POA: Diagnosis not present

## 2019-02-24 DIAGNOSIS — E785 Hyperlipidemia, unspecified: Secondary | ICD-10-CM

## 2019-02-24 MED ORDER — ATORVASTATIN CALCIUM 10 MG PO TABS: 10.0000 mg | ORAL_TABLET | Freq: Every day | ORAL | 2 refills | Status: AC

## 2019-02-24 MED ORDER — SERTRALINE HCL 50 MG PO TABS: 100.0000 mg | ORAL_TABLET | Freq: Every day | ORAL | Status: DC

## 2019-02-24 NOTE — Progress Notes (Signed)
Patient discharged home with husband. All questions answered at time of discharge.    1603: Family aware patient left belongings in room. Belongings bagged and tagged, placed in soiled utility room. Family states they will come later this week to get belongings.   Lianne Bushy RN BSN.

## 2019-02-24 NOTE — Discharge Summary (Addendum)
Stroke Discharge Summary  Patient ID: [name] [name]   MRN: [id number]      DOB: 02-Apr-1975  Date of Admission: 02/21/2019 Date of Discharge: 02/24/2019  Attending Physician:  [name] [name], Stroke [name] Consultant(s):    None  Patient's PCP:  [name] Loader, FNP  DISCHARGE DIAGNOSIS:  Principal Problem:   ICH (intracerebral hemorrhage) ([name] [name]) - R occipital lobe Active Problems:   Hyperlipemia   Migraine / tension HA   Past Medical History:  Diagnosis Date  . ICH (intracerebral hemorrhage) ([name] [name]) 02/21/2019   Past Surgical History:  Procedure Laterality Date  . CESAREAN SECTION      Allergies as of 02/24/2019      Reactions   Other    "berries"      Medication List    STOP taking these medications   aspirin-acetaminophen-caffeine 250-250-65 MG tablet Commonly known as: EXCEDRIN MIGRAINE   ibuprofen 200 MG tablet Commonly known as: ADVIL     TAKE these medications   atorvastatin 10 MG tablet Commonly known as: Lipitor Take 1 tablet (10 mg total) by mouth daily.   Junel FE 1/20 1-20 MG-MCG tablet Generic drug: norethindrone-ethinyl estradiol Take 1 tablet by mouth daily.   sertraline 100 MG tablet Commonly known as: ZOLOFT Take 100 mg by mouth daily.       LABORATORY STUDIES CBC    Component Value Date/Time   WBC 8.0 02/23/2019 0345   RBC 4.73 02/23/2019 0345   HGB 14.1 02/23/2019 0345   HCT 41.9 02/23/2019 0345   PLT 245 02/23/2019 0345   MCV 88.6 02/23/2019 0345   MCH 29.8 02/23/2019 0345   MCHC 33.7 02/23/2019 0345   RDW 13.5 02/23/2019 0345   LYMPHSABS 1.6 02/21/2019 1003   MONOABS 0.5 02/21/2019 1003   EOSABS 0.1 02/21/2019 1003   BASOSABS 0.0 02/21/2019 1003   CMP    Component Value Date/Time   NA 137 02/23/2019 0345   K 3.9 02/23/2019 0345   CL 102 02/23/2019 0345   CO2 26 02/23/2019 0345   GLUCOSE 96 02/23/2019 0345   BUN 12 02/23/2019 0345   CREATININE 0.82 02/23/2019 0345   CALCIUM 8.8 (L) 02/23/2019 0345   PROT 7.5  02/21/2019 1003   ALBUMIN 3.9 02/21/2019 1003   AST 20 02/21/2019 1003   ALT 21 02/21/2019 1003   ALKPHOS 49 02/21/2019 1003   BILITOT 0.5 02/21/2019 1003   GFRNONAA >60 02/23/2019 0345   GFRAA >60 02/23/2019 0345   COAGS Lab Results  Component Value Date   INR 1.0 02/21/2019   Lipid Panel    Component Value Date/Time   CHOL 180 02/22/2019 0400   TRIG 124 02/22/2019 0400   HDL 43 02/22/2019 0400   CHOLHDL 4.[name] 02/22/2019 0400   VLDL 25 02/22/2019 0400   LDLCALC 112 (H) 02/22/2019 0400   HgbA1C  Lab Results  Component Value Date   HGBA1C 4.8 02/22/2019   Urinalysis    Component Value Date/Time   COLORURINE YELLOW 02/21/2019 1119   APPEARANCEUR CLEAR 02/21/2019 1119   LABSPEC 1.021 02/21/2019 1119   PHURINE 6.0 02/21/2019 1119   [place] 02/21/2019 1119   [place] 02/21/2019 [place] 02/21/2019 1119   [place] 02/21/2019 [place] 02/21/2019 1119   NITRITE NEGATIVE 02/21/2019 1119   LEUKOCYTESUR NEGATIVE 02/21/2019 1119   Urine Drug Screen     Component Value Date/Time   LABOPIA NONE DETECTED 02/21/2019 [place]  NONE DETECTED 02/21/2019 1119   LABBENZ NONE DETECTED 02/21/2019 1119   AMPHETMU NONE DETECTED 02/21/2019 1119   THCU NONE DETECTED 02/21/2019 1119   LABBARB NONE DETECTED 02/21/2019 1119    Alcohol Level    Component Value Date/Time   ETH <10 02/21/2019 1003     SIGNIFICANT DIAGNOSTIC STUDIES Cerebral angio 01/23/2019 1.Attenuated caliber of RT mid transverse sinus? Underlying stenosis.. Otherwise negative for aneurysm or ,AV shunt Or AVM .  Ct Angio Head W Or Wo Contrast Ct Angio Neck W And/or Wo Contrast 02/21/2019 1. [name].9 cm acute right occipital intraparenchymal hemorrhage.  [name]. Patent Circle of Willis without major branch occlusion, significant proximal stenosis, aneurysm, or vascular malformation.  3. Widely patent cervical carotid and vertebral arteries.   Mr  [name] [name] Wo Contrast Mr Mrv [name] [name] Wo Contrast 02/21/2019 Known right occipital hematoma with similar dimensions to prior head CT. An underlying cause is not seen, including mass, infarct, or venous thrombosis.   Transthoracic Echocardiogram  1. Left ventricular ejection fraction, by visual estimation, is 60 to 65%. The left ventricle has normal function. There is no left ventricular hypertrophy. [name]. Global right ventricle has normal systolic function.The right ventricular size is normal. No increase in right ventricular wall thickness. 3. Left atrial size was normal. 4. Right atrial size was normal. 5. Mild mitral annular calcification. 6. The mitral valve is normal in structure. Trace mitral valve regurgitation. No evidence of mitral stenosis. 7. The tricuspid valve is normal in structure. Tricuspid valve regurgitation is mild. 8. The aortic valve is tricuspid. Aortic valve regurgitation is not visualized. Mild aortic valve sclerosis without stenosis. 9. The pulmonic valve was normal in structure. Pulmonic valve regurgitation is not visualized. 10. Normal pulmonary artery systolic pressure. 11. The inferior vena cava is normal in size with greater than 50% respiratory variability, suggesting right atrial pressure of 3 mmHg.  ECG - SR rate 83 BPM. (See cardiology reading for complete details)    [name] [name] is an 43 y.o. female with no significant past medical history who presented to [name] [name] ED with c/o of vision loss and HA. transferred to [name] [name]. [name] [name] ED for management of [name] [name].  At about 1730 yesterday 02/20/2019 (LKW)  patient had a HA on the right side of her head. She described it as a sharp pain like a stabbing sensation. This pain lasted approximately 30 minutes. She noticed a loss of vision on the left side of her visual field when she was looking at her cell phone at the time and could only see the 30 of  5:30. She reports trouble reading words also. She can " fill in the blanks / guess what the word should be", but words are missing the letters on the left side. She went to bed thinking her vision would improve. It did not improve and she came to the [name]. Denies smoking history. Patient is on sertraline and birth control with no recent changes to medications. Patient reports that she has had migraines in the past where she has seen spots, but nothing like this. She has a HA about once per week, but nothing this bad. CT showed a [name].9cm acute right occipital intraparenchymal hemorrhage. CTA no LVO; BP: 125/88 BG: 108. ICH score: 0. She was admitted to the neuro ICU.   [name] COURSE [name] is a 43 y.o. female with history of migraine headaches and depression presenting to [facility] with C/o of  vision loss and HA. She did not receive IV t-PA due to [place].  ICH: small acute right occipital ICH. Cause unknown, DDx include small AVM, RCVS, CVT or hemorrhagic infarct.   CT head [name].9 cm acute right occipital intraparenchymal hemorrhage.  MRI W & WO - Known right occipital hematoma with similar dimensions to prior head CT. An underlying cause is not seen, including mass, infarct, or venous thrombosis.   MRV - neg for CVT  CTA H&N - unremarkable, no AVM or aneurysm or vasospasm  2D Echo EF 60-65%  Cerebral angiogram possible attenuated caliber of R mid transverse sinus, no findings associated with current hemorrhage     [name] [name] - negative  LDL - 112  HgbA1c 4.8  UDS - negative  No antithrombotic prior to admission, now on No antithrombotic given hemorrhage   Therapy recommendations:  OP OT, no PT or SLP  Disposition:  Return home - no driving yet until cleared by ophthalmology as outpt  Repeat angio in 6 weeks  BP management  Home BP meds: none, no hx HTN   Current BP meds: Labetalol prn  SBP goal in [name] < 140 mm Hg  BP goal normotensive at  d/c  Hyperlipidemia  Home Lipid lowering medication: none   LDL 112  Add low dose lipitor at discharge    Migraines/tension HA  Hx of HA but mild  Not specific in one spot  excedrin PRN PTA  Other Stroke Risk Factors  OCP use - advised to stop OCP. Follow up with GNP/PCP for non-hormonal options if needed  Other Active Problems  Mild Leukocytosis, resolved - 10.9 (afebrile) - 8.0   DISCHARGE EXAM Blood pressure 115/68, pulse 86, temperature 99.1 F (37.3 C), temperature source Oral, resp. rate 17, height 5\' 7"  (1.702 m), weight 75.4 kg, last menstrual period 02/14/2019, SpO2 98 %. General - Well nourished, well developed, in no apparent distress.  Ophthalmologic - fundi not visualized due to noncooperation.  Cardiovascular - Regular rhythm and rate.  Mental Status -  Level of arousal and orientation to time, place, and person were intact. Language including expression, naming, repetition, comprehension was assessed and found intact. Attention span and concentration were normal. Fund of Knowledge was assessed and was intact.  Cranial Nerves II - XII - II - Visual field intact OU, no hemianopia, but left visual field blurry with shadows on objects, decreased acuity comparing with right. III, IV, VI - Extraocular movements intact. V - Facial sensation intact bilaterally. VII - Facial movement intact bilaterally. VIII - Hearing & vestibular intact bilaterally. X - Palate elevates symmetrically. XI - Chin turning & shoulder shrug intact bilaterally. XII - Tongue protrusion intact.  Motor Strength - The patient's strength was normal in all extremities and pronator drift was absent.  Bulk was normal and fasciculations were absent.   Motor Tone - Muscle tone was assessed at the neck and appendages and was normal.  Reflexes - The patient's reflexes were symmetrical in all extremities and she had no pathological reflexes.  Sensory - Light touch,  temperature/pinprick were assessed and were symmetrical.    Coordination - The patient had normal movements in the hands and feet with no ataxia or dysmetria.  Tremor was absent.  Gait and Station - deferred.  Discharge Diet   Regular thin liquids  DISCHARGE PLAN  Disposition:  Return home  Outpatient OT  No driving until cleared by OP opthalmology   Stop OCP use. F/u w/ GYN or PCP  for non hormonal options if needed  Due to hemorrhage and risk of bleeding, do not take aspirin, aspirin-containing medications, or ibuprofen products   Repeat angio in 6 weeks  Ongoing stroke risk factor control by Primary Care Physician at time of discharge  Follow-up [name] Loader, FNP in [name] weeks - follow up after hospitalization, statin initiation   Follow-up in [facility] in 4 weeks, office to schedule an appointment.   35 minutes were spent preparing discharge.  [name] Hawking, MD PhD Stroke Neurology 12/[name]/2020 1:11 AM

## 2019-02-24 NOTE — TOC Transition Note (Signed)
Transition of Care [facility]) - CM/SW Discharge Note   Patient Details  Name: [name] [name] MRN: [id number] Date of Birth: 07/10/1975  Transition of Care [facility]) CM/SW Contact:  Ella Bodo, RN Phone Number: 02/24/2019, 2:12 PM   Clinical Narrative: 43 yo admitted with HA and left visual field loss with CT demonstrating right occipital intraparenchymal hemorrhage.  PTA, pt independent, lives at home with spouse/children.  OT recommending OP therapy; referral made to [facility] for follow up.  Rehab center will call pt for appointment.      Final next level of care: OP Rehab Barriers to Discharge: Barriers Resolved            Discharge Plan and Services   Discharge Planning Services: CM Consult                                 Social Determinants of Health (SDOH) Interventions     Readmission Risk Interventions No flowsheet data found.  [name] Raddle, RN, BSN  Trauma/Neuro ICU Case Manager [phone]

## 2019-02-25 ENCOUNTER — Encounter (HOSPITAL_COMMUNITY): Payer: Self-pay | Admitting: Interventional Radiology

## 2019-03-02 ENCOUNTER — Encounter (HOSPITAL_COMMUNITY): Payer: Self-pay

## 2019-03-03 ENCOUNTER — Other Ambulatory Visit (HOSPITAL_COMMUNITY): Payer: Self-pay | Admitting: Interventional Radiology

## 2019-03-03 DIAGNOSIS — E782 Mixed hyperlipidemia: Secondary | ICD-10-CM | POA: Diagnosis not present

## 2019-03-03 DIAGNOSIS — I619 Nontraumatic intracerebral hemorrhage, unspecified: Secondary | ICD-10-CM

## 2019-03-03 DIAGNOSIS — I629 Nontraumatic intracranial hemorrhage, unspecified: Secondary | ICD-10-CM | POA: Diagnosis not present

## 2019-03-03 DIAGNOSIS — F411 Generalized anxiety disorder: Secondary | ICD-10-CM | POA: Diagnosis not present

## 2019-03-10 ENCOUNTER — Telehealth: Payer: Self-pay | Admitting: Adult Health

## 2019-03-10 ENCOUNTER — Other Ambulatory Visit: Payer: Self-pay | Admitting: *Deleted

## 2019-03-10 NOTE — Telephone Encounter (Signed)
This could potentially be stroke related but will further evaluate during office visit next week and may need possible referral to ENT for further evaluation but this will be discussed further with patient during visit.  Please advise her during the meantime if symptoms worsen to proceed to ED for further evaluation

## 2019-03-10 NOTE — Patient Outreach (Signed)
[name] [name] [facility]) Care Management  03/10/2019  [name] [name] [birth year] [id number]   Subjective: Telephone call to patient's home  / mobile number, no answer, left HIPAA compliant voicemail message, and requested call back.    Objective: Per KPN (Knowledge Performance Now, point of care tool) and chart review, patient hospitalized 02/21/2019 - 02/24/2019 for ICH (intracerebral hemorrhage).  Patient also has a history of hyperlipidemia, migraine, and tension headache.       Assessment: Received Pharmacologist [name] referral on 03/09/2019.   Red Flag Alert Trigger, Day #9, patient answered yes to the following question:  Sad, hopeless, anxious, or empty?  [name] EMMI follow up pending patient contact.     Plan: RNCM will send unsuccessful outreach letter, [facility] pamphlet, handout: Know Before You Go, will call patient for 2nd telephone outreach attempt within 4 business days, [facility] EMMI follow up, and proceed with case closure, within 10 business days if no return call.      [name] [name] H. Annia Friendly, BSN, [place] Management [facility] Telephonic CM Phone: [phone] Fax: [phone]

## 2019-03-10 NOTE — Telephone Encounter (Signed)
I called pt, we have not seen in office.  States since she left hospital has bilateral ear crackling sounds, wakes her at night.  R ear pulsating worse at night.  No pain, Bp good. Constant, annoying.  Has not seen pcp for this inparticular.  She has appt Monday here and 03-31-19 an angiogram.  She was not sure if this is something that is worrisome.  I relayed that would be glad to ask and get back with them.  Saw Dr. Erlinda Hong in [facility] for [place].

## 2019-03-10 NOTE — Telephone Encounter (Signed)
Pt husband Merry Proud called in and stated pt has been complaining of crackling in her ear and she states she can hear static and her blood pulsing. Moved pts to a sooner appt. Pts husband Merry Proud would like a call back to discuss  CB# [phone]

## 2019-03-11 ENCOUNTER — Other Ambulatory Visit: Payer: Self-pay | Admitting: *Deleted

## 2019-03-11 ENCOUNTER — Encounter: Payer: Self-pay | Admitting: *Deleted

## 2019-03-11 NOTE — Telephone Encounter (Signed)
I called pt back.  I relayed that JM/NP, could be stroke related but will proceed with evaluation next week, possible referral to ENT, but if sx worsen to proceed to ED for evaluation.  She verbalized understanding.

## 2019-03-11 NOTE — Patient Outreach (Addendum)
[name]) Care Management  03/11/2019  [name] [name] 01-Nov-1975 [id number]   Subjective: Received voicemail from [name] [name], states she is returning call, and requested call back.  Telephone call to patient's home / mobile number, spoke with patient, and HIPAA verified.  Discussed [facility] Care Management [employer]  EMMI Stroke Red Flag Alert follow up, patient voiced understanding, and is in agreement to follow up.  Patient states she is doing much better, feeling tired, tires easily, working on increasing endurance, take rest breaks as needed, taking it one day at a time, pacing activities of daily living, and remembers receiving EMMI automated calls.   States she has a history of anxiety, currently taking sertraline (Zoloft) anxiety has increased since her stroke, and not knowing why this happened.   States she has discussed her increased anxiety with provider on 03/03/2019 during hospital follow up office visit, medication added ( Atarax), is helping her to sleep better, and Zoloft dosage also increased to 150 mg per day, has noted slight improvement in symptoms.  Patient states she is aware of signs/ symptoms to report, how to reach provider if needed after hours, when to go to ED, and / or call 911.   Patient states she is able to manage self care and has assistance as needed.  States she has follow up appointments with the following providers: neurologist nurse practitioner on 03/16/2019, interventional neuro radiologist on 03/31/2019 for repeat angiogram, and with opthalmologist on 04/07/2019.  States she is aware of her driving restrictions until she is cleared by ophthalmologist and family providing transportation as needed.  States she is planning to follow up with [facility] on 03/16/2019 to schedule outpatient occupational therapy evaluation per discharge instructions.  States she had spoken with facility since hospital discharge to schedule outpatient  occupational therapy evaluation, was told that the facility was experiencing staffing issues, facility was to discuss with management, and call her back with an update, but has not received a call back to date.   States she is accessing her [employer] benefits as needed via member services number on back of card.  Patient voices understanding of medical diagnosis and treatment plan.  Depression screening assessment completed, results discussed with patient, patient voices understanding, patient advised [place] Management  Social Worker can provide counseling / behavioral health community resources if needed, how to access behavioral health resources through [facility], and provider as needed, patient voiced understanding, patient states will follow up as needed.   Patient states she does not have any education material, EMMI follow up, care coordination, care management, disease monitoring, transportation, community resource, or pharmacy needs at this time.   Patient in agreement to 1 additional call back from this [facility] after MD appointment next week to discuss visit outcomes and RNCM will verify if patient has any additional care management needs. States she is very appreciative of the follow up and is in agreement to receive [place] Management EMMI follow up calls as needed.     Objective: Per KPN (Knowledge Performance Now, point of care tool) and chart review, patient hospitalized 02/21/2019 - 02/24/2019 for ICH (intracerebral hemorrhage).  Patient also has a history of hyperlipidemia, migraine, and tension headache.       Assessment: Received Pharmacologist [name] referral on 03/09/2019.   Red Flag Alert Trigger, Day #9, patient answered yes to the following question:  Sad, hopeless, anxious, or empty?  EMMI follow up completed and follow  up to verify if any further care management needs.      Plan: RNCM will call patient for additional telephone outreach  attempt within 7 business days, [facility] EMMI follow up, and proceed with case closure, within 10 business days if no return call.     [name] [name] H. Annia Friendly, BSN, [place] Management [facility] Telephonic CM Phone: [phone] Fax: [phone]

## 2019-03-16 ENCOUNTER — Other Ambulatory Visit: Payer: Self-pay

## 2019-03-16 ENCOUNTER — Encounter: Payer: Self-pay | Admitting: Adult Health

## 2019-03-16 ENCOUNTER — Ambulatory Visit: Payer: BC Managed Care – PPO | Admitting: Adult Health

## 2019-03-16 VITALS — BP 115/78 | HR 87 | Temp 97.8°F | Ht 67.0 in | Wt 169.6 lb

## 2019-03-16 DIAGNOSIS — H9311 Tinnitus, right ear: Secondary | ICD-10-CM | POA: Diagnosis not present

## 2019-03-16 DIAGNOSIS — I611 Nontraumatic intracerebral hemorrhage in hemisphere, cortical: Secondary | ICD-10-CM | POA: Diagnosis not present

## 2019-03-16 DIAGNOSIS — E785 Hyperlipidemia, unspecified: Secondary | ICD-10-CM | POA: Diagnosis not present

## 2019-03-16 DIAGNOSIS — F419 Anxiety disorder, unspecified: Secondary | ICD-10-CM

## 2019-03-16 DIAGNOSIS — H547 Unspecified visual loss: Secondary | ICD-10-CM

## 2019-03-16 NOTE — Progress Notes (Signed)
[facility] [address] [place]. [place] [address] [phone]       HOSPITAL FOLLOW UP NOTE  Ms. Alveda Reasons Date of Birth:  1975/12/12 Medical Record Number:  [id number]   Reason for Referral:  hospital stroke follow up    CHIEF COMPLAINT:  Chief Complaint  Patient presents with  . Hospitalization Follow-up    Husband present. Rm 9. Patient and her husband would like to discuss what to expect going forward and what are some things she should look out for.     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  History obtained from patient, husband and chart review. Reviewed all radiology images and labs personally.  MorganBrandilee P Sylvesteris a 43 y.o.femalewith history of migraine headaches and depression presented on 02/21/2019 to [facility] with C/o of vision loss and HA. Stroke work-up revealed small acute right occipital ICH on CT head and MRI brain secondary to unknown cause. Shedid not receive IV t-PA due to [place]. MRI brain w/wo did not show any underlying abnormalities such as mass, infarct or venous thrombosis.  MRV negative for CVT.  CTA head/neck unremarkable without evidence of AVM, aneurysm or vasospasm.  2D echo showed an EF of 60 to 65%.  Cerebral angiogram showed possible attenuated caliber of the right mid transverse sinus but no findings associated with current hemorrhage.  SARS coronavirus 2 negative.  No prior antithrombotic use and did not recommend initiating due to hemorrhage.  No prior history or evidence of HTN.  LDL 112 and recommend initiating low-dose atorvastatin at discharge.  No evidence or history of DM with A1c 4.8.  History of migraines/tension headache with use of Excedrin as needed PTA.  Other stroke risk factors include OCP use and advise discontinuing.  Discharged home in stable condition with recommendation of outpatient OT and recommended repeating  cerebral angiogram in 6 weeks.  [name] [name] is a 43 year old female who is being seen today for hospital follow-up.  Residual deficits of difficulty reading such as difficulty focusing on letters or numbers and mild frontal headaches.  She does endorse improvement of vision and attempted to participate in outpatient OT per patient, therapy was on hold due to "shortstaffed" at neuro rehab.  She has not received a phone call since that time to schedule visit.  She also experienced blurriness left eye upper vision while watching TV that lasted only short duration accompanied by mild tension headache throughout the day.  She has not experienced any recurrent blurriness sensation.  She does have initial evaluation with ophthalmology on 04/06/2018 with hopes of returning to driving after that appointment.  Underlying history of migraines which she has not experienced recently but does have frequent tension type headaches along the frontal portion of her head but patient believes this is due to increased stress and anxiety.  Underlying history of anxiety with recent dose increase of sertraline from 100 mg daily to 150 mg daily along with initiating hydroxyzine 25 mg nightly as needed.  She does endorse improvement.  Husband did call into office last week regarding patient complaining of bilateral "ear cracking sound" along with right ear pulsating.  She does endorse history of "jaw cracking" and pain over the past 3 to 6 months months also accompanied by "ear cracking sound".  Husband endorses grinding of teeth at night.  Right ear pulsating sensation has been present since her stroke.  Typically only present when she lays down  or when she is using her cell phone on the left side.  She denies any decreased hearing.  She does have history of reconstructive therapy on her jaw at age 55. she is scheduled to repeat cerebral angiogram on 03/31/2019.  She has continued on atorvastatin without myalgias.  Blood pressure  satisfactory at 115/78.  She does express concerns regarding unknown etiology of ICH and fear of reoccurring ICH.  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  No further concerns at this time.   ROS:   14 system review of systems performed and negative with exception of tinnitus, jaw pain, headache, visual impairment  PMH:  Past Medical History:  Diagnosis Date  . ICH (intracerebral hemorrhage) ([place]) 02/21/2019    PSH:  Past Surgical History:  Procedure Laterality Date  . CESAREAN SECTION    . IR ANGIO INTRA EXTRACRAN SEL COM CAROTID INNOMINATE BILAT MOD SED  02/23/2019  . IR ANGIO VERTEBRAL SEL VERTEBRAL BILAT MOD SED  02/23/2019    Social History:  Social History   Socioeconomic History  . Marital status: Married    Spouse name: Not on file  . Number of children: Not on file  . Years of education: Not on file  . Highest education level: Not on file  Occupational History  . Not on file  Tobacco Use  . Smoking status: Never Smoker  . Smokeless tobacco: Never Used  Substance and Sexual Activity  . Alcohol use: Never  . Drug use: Never  . Sexual activity: Not on file  Other Topics Concern  . Not on file  Social History Narrative  . Not on file   Social Determinants of Health   Financial Resource Strain:   . Difficulty of Paying Living Expenses: Not on file  Food Insecurity:   . Worried About Charity fundraiser in the Last Year: Not on file  . Ran Out of Food in the Last Year: Not on file  Transportation Needs: No Transportation Needs  . Lack of Transportation (Medical): No  . Lack of Transportation (Non-Medical): No  Physical Activity:   . Days of Exercise per Week: Not on file  . Minutes of Exercise per Session: Not on file  Stress:   . Feeling of Stress : Not on file  Social Connections:   . Frequency of Communication with Friends and Family: Not on file  . Frequency of Social Gatherings with  Friends and Family: Not on file  . Attends Religious Services: Not on file  . Active Member of Clubs or Organizations: Not on file  . Attends Archivist Meetings: Not on file  . Marital Status: Not on file  Intimate Partner Violence:   . Fear of Current or Ex-Partner: Not on file  . Emotionally Abused: Not on file  . Physically Abused: Not on file  . Sexually Abused: Not on file    Family History:  Family History  Problem Relation Age of Onset  . Hypertension Mother   . Hypertension Father   . Diabetes Father     Medications:   Current Outpatient Medications on File Prior to Visit  Medication Sig Dispense Refill  . atorvastatin (LIPITOR) 10 MG tablet Take 1 tablet (10 mg total) by mouth daily. 30 tablet 2  . hydrOXYzine (ATARAX/VISTARIL) 25 MG tablet Take 25 mg by mouth every 8 (eight) hours as needed.    . sertraline (ZOLOFT) 100 MG tablet Take 150 mg  by mouth daily.      No current facility-administered medications on file prior to visit.    Allergies:   Allergies  Allergen Reactions  . Other     "berries"     Physical Exam  Vitals:   03/16/19 1259  BP: 115/78  Pulse: 87  Temp: 97.8 F (36.6 C)  TempSrc: Oral  Weight: 169 lb 9.6 oz (76.9 kg)  Height: 5\' 7"  (1.702 m)   Body mass index is 26.56 kg/m. No exam data present  Depression screen [facility] 2/9 03/16/2019  Decreased Interest 0  Down, Depressed, Hopeless 1  PHQ - 2 Score 1    GAD 7 : Generalized Anxiety Score 03/16/2019  Nervous, Anxious, on Edge 3  Control/stop worrying 3  Worry too much - different things 3  Trouble relaxing 3  Restless 3  Easily annoyed or irritable 0  Afraid - awful might happen 3  Total GAD 7 Score 18  Anxiety Difficulty Somewhat difficult    General: well developed, well nourished,  pleasant middle-age Caucasian female, seated, in no evident distress Head: head normocephalic and atraumatic.   Neck: supple with no carotid or supraclavicular  bruits Cardiovascular: regular rate and rhythm, no murmurs Musculoskeletal: no deformity Skin:  no rash/petichiae Vascular:  Normal pulses all extremities   Neurologic Exam Mental Status: Awake and fully alert. Oriented to place and time. Recent and remote memory intact. Attention span, concentration and fund of knowledge appropriate. Mood and affect appropriate.  Cranial Nerves: Fundoscopic exam reveals sharp disc margins. Pupils equal, briskly reactive to light. Extraocular movements full without nystagmus.  Evidence of left eye convergence insufficiency.  Visual fields full to confrontation. Hearing intact. Facial sensation intact. Face, tongue, palate moves normally and symmetrically.  Motor: Normal bulk and tone. Normal strength in all tested extremity muscles. Sensory.: intact to touch , pinprick , position and vibratory sensation.  Coordination: Rapid alternating movements normal in all extremities. Finger-to-nose and heel-to-shin performed accurately bilaterally. Gait and Station: Arises from chair without difficulty. Stance is normal. Gait demonstrates normal stride length and balance Reflexes: 1+ and symmetric. Toes downgoing.     NIHSS  0 Modified Rankin  2    Diagnostic Data (Labs, Imaging, Testing)  Cerebral angio 01/23/2019 1.Attenuated caliber of RT mid transverse sinus? Underlying stenosis.. Otherwise negative for aneurysm or ,AV shunt Or AVM .  Ct Angio Head W Or Wo Contrast Ct Angio Neck W And/or Wo Contrast 02/21/2019 1. 2.9 cm acute right occipital intraparenchymal hemorrhage.  2. Patent Circle of Willis without major branch occlusion, significant proximal stenosis, aneurysm, or vascular malformation.  3. Widely patent cervical carotid and vertebral arteries.   Mr Jeri Cos Wo Contrast Mr Mrv Miguel Dibble Wo Contrast 02/21/2019 Known right occipital hematoma with similar dimensions to prior head CT. An underlying cause is not seen, including mass, infarct, or  venous thrombosis.   Transthoracic Echocardiogram  1. Left ventricular ejection fraction, by visual estimation, is 60 to 65%. The left ventricle has normal function. There is no left ventricular hypertrophy. 2. Global right ventricle has normal systolic function.The right ventricular size is normal. No increase in right ventricular wall thickness. 3. Left atrial size was normal. 4. Right atrial size was normal. 5. Mild mitral annular calcification. 6. The mitral valve is normal in structure. Trace mitral valve regurgitation. No evidence of mitral stenosis. 7. The tricuspid valve is normal in structure. Tricuspid valve regurgitation is mild. 8. The aortic valve is tricuspid. Aortic valve regurgitation is not visualized.  Mild aortic valve sclerosis without stenosis. 9. The pulmonic valve was normal in structure. Pulmonic valve regurgitation is not visualized. 10. Normal pulmonary artery systolic pressure. 11. The inferior vena cava is normal in size with greater than 50% respiratory variability, suggesting right atrial pressure of 3 mmHg.  ECG - SR rate 83 BPM. (See cardiology reading for complete details)    ASSESSMENT: [name] [name] is a 43 y.o. year old female presented with complaints of vision loss and headache on 02/21/2019 with stroke work-up revealing small acute right occipital ICH secondary to unknown cause with MRI w/wo, MRV and cerebral angiogram all unremarkable without evidence of mass, infarct, venous thrombosis, AVM, aneurysm or vasospasm. Vascular risk factors include HLD and migraine headaches.  She continues to experience left visual field visual impairment with difficulty focusing while reading.  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  Increased anxiety from stroke along with continued mild tension type headaches   PLAN:  1. Right occipital ICH: Repeat MRI w/wo contrast to assess for possible underlying etiologies of recent ICH.  She also  plans on undergoing cerebral angiogram on 03/30/2018 with Dr. Estanislado Pandy.  Advised to avoid aspirin, aspirin-containing products or ibuprofen products due to hemorrhage.  No indication of initiating aspirin as no prior history of stroke or cardiac conditions.  Continue atorvastatin for secondary stroke prevention. Maintain strict control of hypertension with blood pressure goal below 130/90, diabetes with hemoglobin A1c goal below 6.5% and cholesterol with LDL cholesterol (bad cholesterol) goal below 70 mg/dL.  I also advised the patient to eat a healthy diet with plenty of whole grains, cereals, fruits and vegetables, exercise regularly with at least 30 minutes of continuous activity daily and maintain ideal body weight. 2. HLD: Advised to continue current treatment regimen along with continued follow-up with PCP for future prescribing and monitoring of lipid panel 3. Visual impairment, poststroke: Follow-up with ophthalmology scheduled on 04/06/2018 for further evaluation.  Also recommended following up with neuro rehab OT for initial evaluation but if they continue to be short staffed to notify office and referral will be placed elsewhere.  She was advised no driving until follow-up with ophthalmology 4. 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 Anxiety: Underlying history of anxiety worsened with recent stroke.  Recently increased sertraline and initiated hydroxyzine by PCP with benefit.  Advised her to continue at this time.  She states she will feel better once repeat MRI and cerebral angiogram are completed.  Discussion regarding slowly return to prior activities along with relaxation exercises.  Ongoing mild tension headaches likely associated with increased anxiety and stress.  No indication for medication management at this time but was advised to  call office with any worsening   Follow up in 2 months or call earlier if needed   Greater than 50% of time during this 45 minute visit was spent on counseling, explanation of diagnosis of right occipital ICH, reviewing risk factor management of HLD and migraines, discussion regarding residual deficits, planning of further management along with potential future management, discussion regarding stroke/TIA signs and symptoms and to call [phone] immediately with any concerns and discussion with patient and family answering all questions.    Frann Rider, AGNP-BC  [facility] [address] [place], [place] [address]  Phone [phone] Fax [phone] Note: This document was prepared with digital dictation and possible  smart Company secretary. Any transcriptional errors that result from this process are unintentional.

## 2019-03-16 NOTE — Patient Instructions (Signed)
Recommend repeating MRI w/wo contrast to assess for resolution of prior bleed and possible underlying causes Undergo cerebral angiogram with Dr. Estanislado Pandy on 03/30/2018  Referral will be placed to be evaluated by ENT provider -you will be called to schedule initial consult  Recommend participation with OT for ongoing improvement of your residual visual impairment.  If you need a new order for OT, please let me know and an order will be placed  Follow-up with ophthalmology for further evaluation of visual impairment  Continue atorvastatin for secondary stroke prevention  Avoid aspirin, aspirin-containing products and ibuprofen products  Continue to follow up with PCP regarding cholesterol management   Continue to monitor blood pressure at home  Maintain strict control of hypertension with blood pressure goal below 130/90, diabetes with hemoglobin A1c goal below 6.5% and cholesterol with LDL cholesterol (bad cholesterol) goal below 70 mg/dL. I also advised the patient to eat a healthy diet with plenty of whole grains, cereals, fruits and vegetables, exercise regularly and maintain ideal body weight.  Followup in the future with me in 2 months or call earlier if needed       Thank you for coming to see [place] at [facility]. I hope we have been able to provide you high quality care today.  You may receive a patient satisfaction survey over the next few weeks. We would appreciate your feedback and comments so that we may continue to improve ourselves and the health of our patients.

## 2019-03-17 NOTE — Progress Notes (Signed)
I agree with the above plan 

## 2019-03-18 ENCOUNTER — Other Ambulatory Visit: Payer: Self-pay | Admitting: *Deleted

## 2019-03-18 ENCOUNTER — Telehealth: Payer: Self-pay

## 2019-03-18 NOTE — Patient Outreach (Signed)
[facility]) Care Management  03/18/2019  AKILA HAYASHI [birth year] [id number]   Subjective: Telephone call to patient's home / mobile number times 2 , spoke with patient, and HIPAA verified.  Patient states she is well, remembers speaking with this RNCM in the past, had a follow up appointment with neurologist nurse practitioner on 03/16/2019, appointment went well, and has been referred to [place] (ENT) MD .  States she is waiting for call ENT office to call with appointment date and time, she will also follow up as needed.  States neurologist provider states patient  is recovering well, cause of stroke still unknown, hoping the angiogram on 15/2021 will shed more light on the cause, and/ or will determine if additional follow up needed.   States she will also has a MRI on 04/11/2019.  Patient states she did not stop by neuro rehab office to follow up on the occupational therapy referral due to covid  concerns and there were a lot of people in that office when she attempted to follow up after provider visit.   Discussed importance of follow up on outpatient occupational therapy recommendation, patient voices understanding, states she will call neuro rehab office today after call disconnected with this RNCM,  and follow up to schedule outpatient occupational therapy.   Patient states she does not have any education material, EMMI follow up, care coordination, care management, disease monitoring, transportation, community resource, or pharmacy needs at this time.  States she is very appreciative of the follow up and is in agreement to receive [place] [facility] EMMI follow up calls as needed.  States she will follow up with [facility] if services needed in the future.   Objective:Per KPN (Knowledge Performance Now, point of care tool) and chart review,patient hospitalized 02/21/2019 - 02/24/2019 forICH (intracerebral hemorrhage). Patient also has a history of  hyperlipidemia, migraine, and tension headache.     Assessment: Received Pharmacologist [name] referral on 03/09/2019. Red Flag Alert Trigger, Day #9, patient answered yes to the following question: Sad, hopeless, anxious, or empty? EMMI follow up completed and no further care management needs.     Plan:RNCM will complete case closure due to follow up completed / no care management needs.       Lynk Marti H. Annia Friendly, BSN, [place] [facility] Telephonic CM Phone: [phone] Fax: [phone]

## 2019-03-18 NOTE — Telephone Encounter (Signed)
[name] [name] this MRI request has been denied by her insurance. For p2p please call [phone] on or before Monday 03/23/19. DW

## 2019-03-23 NOTE — Telephone Encounter (Signed)
Attempted to call for a peer-to-peer review but a peer to peer review was not an option with phone number provided

## 2019-03-30 ENCOUNTER — Other Ambulatory Visit: Payer: Self-pay | Admitting: Radiology

## 2019-03-31 ENCOUNTER — Encounter (HOSPITAL_COMMUNITY): Payer: Self-pay

## 2019-03-31 ENCOUNTER — Other Ambulatory Visit: Payer: Self-pay

## 2019-03-31 ENCOUNTER — Ambulatory Visit (HOSPITAL_COMMUNITY)
Admission: RE | Admit: 2019-03-31 | Discharge: 2019-03-31 | Disposition: A | Discharge status: Home / Self Care | Payer: BC Managed Care – PPO | Source: Ambulatory Visit | Attending: Interventional Radiology | Admitting: Interventional Radiology

## 2019-03-31 ENCOUNTER — Other Ambulatory Visit (HOSPITAL_COMMUNITY): Payer: Self-pay | Admitting: Interventional Radiology

## 2019-03-31 DIAGNOSIS — H93A1 Pulsatile tinnitus, right ear: Secondary | ICD-10-CM | POA: Diagnosis not present

## 2019-03-31 DIAGNOSIS — I611 Nontraumatic intracerebral hemorrhage in hemisphere, cortical: Secondary | ICD-10-CM | POA: Insufficient documentation

## 2019-03-31 DIAGNOSIS — I619 Nontraumatic intracerebral hemorrhage, unspecified: Secondary | ICD-10-CM

## 2019-03-31 DIAGNOSIS — R519 Headache, unspecified: Secondary | ICD-10-CM | POA: Insufficient documentation

## 2019-03-31 DIAGNOSIS — Z79899 Other long term (current) drug therapy: Secondary | ICD-10-CM | POA: Insufficient documentation

## 2019-03-31 DIAGNOSIS — H53462 Homonymous bilateral field defects, left side: Secondary | ICD-10-CM | POA: Insufficient documentation

## 2019-03-31 DIAGNOSIS — I618 Other nontraumatic intracerebral hemorrhage: Secondary | ICD-10-CM | POA: Diagnosis not present

## 2019-03-31 DIAGNOSIS — I6613 Occlusion and stenosis of bilateral anterior cerebral arteries: Secondary | ICD-10-CM | POA: Diagnosis not present

## 2019-03-31 HISTORY — PX: IR ANGIO EXTERNAL CAROTID SEL EXT CAROTID UNI R MOD SED: IMG5371

## 2019-03-31 HISTORY — PX: IR ANGIO INTRA EXTRACRAN SEL COM CAROTID INNOMINATE BILAT MOD SED: IMG5360

## 2019-03-31 HISTORY — PX: IR ANGIO VERTEBRAL SEL VERTEBRAL BILAT MOD SED: IMG5369

## 2019-03-31 LAB — BASIC METABOLIC PANEL
Anion gap: 7 (ref 5–15)
BUN: 11 mg/dL (ref 6–20)
CO2: 28 mmol/L (ref 22–32)
Calcium: 9.1 mg/dL (ref 8.9–10.3)
Chloride: 104 mmol/L (ref 98–111)
Creatinine, Ser: 0.68 mg/dL (ref 0.44–1.00)
GFR calc Af Amer: >60 mL/min (ref >60)
GFR calc non Af Amer: >60 mL/min (ref >60)
Glucose, Bld: 91 mg/dL (ref 70–99)
Potassium: 3.6 mmol/L (ref 3.5–5.1)
Sodium: 139 mmol/L (ref 135–145)

## 2019-03-31 LAB — CBC
HCT: 40.6 % (ref 36.0–46.0)
Hemoglobin: 13.4 g/dL (ref 12.0–15.0)
MCH: 29 pg (ref 26.0–34.0)
MCHC: 33 g/dL (ref 30.0–36.0)
MCV: 87.9 fL (ref 80.0–100.0)
Platelets: 244 10*3/uL (ref 150–400)
RBC: 4.62 MIL/uL (ref 3.87–5.11)
RDW: 13.2 % (ref 11.5–15.5)
WBC: 7.4 10*3/uL (ref 4.0–10.5)
nRBC: 0 % (ref 0.0–0.2)

## 2019-03-31 LAB — PROTIME-INR
INR: 1 (ref 0.8–1.2)
Prothrombin Time: 13.1 seconds (ref 11.4–15.2)

## 2019-03-31 LAB — HCG, QUANTITATIVE, PREGNANCY: hCG, Beta Chain, Quant, S: <1 m[IU]/mL (ref <5)

## 2019-03-31 MED ORDER — HEPARIN SODIUM (PORCINE) 1000 UNIT/ML IJ SOLN
INTRAMUSCULAR | Status: AC | PRN
  Administered 2019-03-31: 1000 [IU] via INTRAVENOUS

## 2019-03-31 MED ORDER — IOHEXOL 300 MG/ML  SOLN
150.0000 mL | Freq: Once | INTRAMUSCULAR | Status: AC | PRN
  Administered 2019-03-31: 75 mL via INTRA_ARTERIAL

## 2019-03-31 MED ORDER — FENTANYL CITRATE (PF) 100 MCG/2ML IJ SOLN
INTRAMUSCULAR | Status: AC
  Filled 2019-03-31: qty 2

## 2019-03-31 MED ORDER — LIDOCAINE HCL 1 % IJ SOLN
INTRAMUSCULAR | Status: AC | PRN
  Administered 2019-03-31: 15 mL

## 2019-03-31 MED ORDER — SODIUM CHLORIDE 0.9 % IV SOLN: INTRAVENOUS | Status: AC

## 2019-03-31 MED ORDER — SODIUM CHLORIDE 0.9 % IV SOLN: Freq: Once | INTRAVENOUS | Status: DC

## 2019-03-31 MED ORDER — LIDOCAINE HCL 1 % IJ SOLN
INTRAMUSCULAR | Status: AC
  Filled 2019-03-31: qty 20

## 2019-03-31 MED ORDER — HEPARIN SODIUM (PORCINE) 1000 UNIT/ML IJ SOLN
INTRAMUSCULAR | Status: AC
  Filled 2019-03-31: qty 2

## 2019-03-31 MED ORDER — MIDAZOLAM HCL 2 MG/2ML IJ SOLN
INTRAMUSCULAR | Status: AC
  Filled 2019-03-31: qty 2

## 2019-03-31 MED ORDER — FENTANYL CITRATE (PF) 100 MCG/2ML IJ SOLN
INTRAMUSCULAR | Status: AC | PRN
  Administered 2019-03-31 (×2): 12.5 ug via INTRAVENOUS

## 2019-03-31 MED ORDER — SODIUM CHLORIDE 0.9 % IV SOLN
INTRAVENOUS | Status: AC | PRN
  Administered 2019-03-31: 250 mL via INTRAVENOUS

## 2019-03-31 MED ORDER — MIDAZOLAM HCL 2 MG/2ML IJ SOLN
INTRAMUSCULAR | Status: AC | PRN
  Administered 2019-03-31 (×2): 0.5 mg via INTRAVENOUS

## 2019-03-31 MED ORDER — ACETAMINOPHEN 325 MG PO TABS
ORAL_TABLET | ORAL | Status: AC
  Filled 2019-03-31: qty 2

## 2019-03-31 MED ORDER — ACETAMINOPHEN 325 MG PO TABS
650.0000 mg | ORAL_TABLET | Freq: Once | ORAL | Status: AC
  Administered 2019-03-31: 650 mg via ORAL

## 2019-03-31 NOTE — Discharge Instructions (Addendum)
Femoral Site Care This sheet gives you information about how to care for yourself after your procedure. Your health care provider may also give you more specific instructions. If you have problems or questions, contact your health care provider. What can I expect after the procedure? After the procedure, it is common to have:  Bruising that usually fades within 1-2 weeks.  Tenderness at the site. Follow these instructions at home: Wound care  Follow instructions from your health care provider about how to take care of your insertion site. Make sure you: ? Wash your hands with soap and water before you change your bandage (dressing). If soap and water are not available, use hand sanitizer. ? Change your dressing as told by your health care provider. ? Leave stitches (sutures), skin glue, or adhesive strips in place. These skin closures may need to stay in place for 2 weeks or longer. If adhesive strip edges start to loosen and curl up, you may trim the loose edges. Do not remove adhesive strips completely unless your health care provider tells you to do that.  Do not take baths, swim, or use a hot tub until your health care provider approves.  You may shower 24-48 hours after the procedure or as told by your health care provider. ? Gently wash the site with plain soap and water. ? Pat the area dry with a clean towel. ? Do not rub the site. This may cause bleeding.  Do not apply powder or lotion to the site. Keep the site clean and dry.  Check your femoral site every day for signs of infection. Check for: ? Redness, swelling, or pain. ? Fluid or blood. ? Warmth. ? Pus or a bad smell. Activity  For the first 2-3 days after your procedure, or as long as directed: ? Avoid climbing stairs as much as possible. ? Do not squat.  Do not lift anything that is heavier than 10 lb (4.5 kg), or the limit that you are told, until your health care provider says that it is safe.  Rest as  directed. ? Avoid sitting for a long time without moving. Get up to take short walks every 1-2 hours.  Do not drive for 24 hours if you were given a medicine to help you relax (sedative). General instructions  Take over-the-counter and prescription medicines only as told by your health care provider.  Keep all follow-up visits as told by your health care provider. This is important. Contact a health care provider if you have:  A fever or chills.  You have redness, swelling, or pain around your insertion site. Get help right away if:  The catheter insertion area swells very fast.  You pass out.  You suddenly start to sweat or your skin gets clammy.  The catheter insertion area is bleeding, and the bleeding does not stop when you hold steady pressure on the area.  The area near or just beyond the catheter insertion site becomes pale, cool, tingly, or numb. These symptoms may represent a serious problem that is an emergency. Do not wait to see if the symptoms will go away. Get medical help right away. Call your local emergency services (911 in the [place] Do not drive yourself to the hospital. Summary  After the procedure, it is common to have bruising that usually fades within 1-2 weeks.  Check your femoral site every day for signs of infection.  Do not lift anything that is heavier than 10 lb (4.5 kg), or the   limit that you are told, until your health care provider says that it is safe. This information is not intended to replace advice given to you by your health care provider. Make sure you discuss any questions you have with your health care provider. Document Revised: 03/25/2017 Document Reviewed: 03/25/2017 Elsevier Patient Education  [place]. Cerebral Angiogram  A cerebral angiogram is a procedure that is used to examine the blood vessels in the brain and neck. Contrast dye is injected through a thin tube (catheter) into an artery. X-ray pictures are then taken.  The pictures can show an abnormality in a blood vessel, such as a blockage, narrowing (stenosis), or bulging (aneurysm). Tell a health care provider about:  Any allergies you have, including allergies to medicines, shellfish, contrast dye, or iodine.  All medicines you are taking, including vitamins, herbs, eye drops, creams, and over-the-counter medicines.  Any blood thinning medicines you take, such as aspirin or warfarin.  Any problems you or family members have had with anesthetic medicines.  Any blood disorders you have.  Any surgeries you have had.  Any medical conditions you have or have had, including kidney problems or kidney failure.  Whether you are pregnant or may be pregnant, or are breastfeeding. What are the risks? Generally, this is a safe procedure. However, problems may occur, including:  Problems in the insertion site, such as bleeding, bruising, infection, or collection of blood under the skin (hematoma).  Allergic reaction to medicines or dyes.  Damage to nearby structures or organs, including blood vessels or arteries. Also, contrast dye can damage the kidneys.  Blood clot.  Weakness, numbness, speech, or vision problems. This is usually temporary.  Stroke.  Amnesia, or being unable to remember what happened (rare). This is temporary. What happens before the procedure? Staying hydrated Follow instructions from your healthcare provider about hydration, which may include:  Up to 2 hours before the procedure - you may continue to drink clear liquids, such as water, clear fruit juice, black coffee, and plain tea. Eating and drinking restrictions Follow instructions from your health care provider about eating and drinking, which may include:  8 hours before the procedure - stop eating heavy meals or foods, such as meat, fried foods, or fatty foods.  6 hours before the procedure - stop eating light meals or foods, such as toast or cereal.  6 hours before  the procedure - stop drinking milk or drinks that contain milk.  2 hours before the procedure - stop drinking clear liquids. Medicines Ask your health care provider about:  Changing or stopping your regular medicines. This is especially important if you are taking diabetes medicines or blood thinners.  Taking medicines such as aspirin and ibuprofen. These medicines can thin your blood. Do not take these medicines unless your health care provider tells you to take them.  Taking over-the-counter medicines, vitamins, herbs, and supplements. General instructions  Do not use any products that contain nicotine or tobacco for at least 4 weeks before the procedure. These products include cigarettes, e-cigarettes, and chewing tobacco. If you need help quitting, ask your health care provider.  You may have blood tests done.  Plan to have someone take you home from the hospital or clinic.  If you will be going home the same day of the procedure, plan to have someone with you for 24 hours.  Ask your health care provider what steps will be taken to prevent infection. These may include: ? Removing hair at the insertion  site. ? Washing skin with a germ-killing soap. ? Taking antibiotic medicine. What happens during the procedure?  You will lie on an X-ray table. Your head and legs may be strapped down.  An IV will be inserted into one of your veins.  You will be given one or both of the following: ? A medicine to help you relax (sedative). ? A medicine to numb the area (local anesthetic) where the catheter will be inserted, usually in your groin, leg, or arm.  Your heart rate and other vital signs will be watched carefully. Electrodes may be placed on your chest.  A small incision will be made. The catheter will be moved through the incision up to the blood vessels in your neck and brain.  Dye will be injected into the catheter and will travel to the blood vessels of the brain and neck. You  may notice a warm feeling or strange taste in your mouth.  You will be asked to lie still. X-rays will be taken to show the flow of the dye through the blood vessels in the brain and neck.  If an abnormality is found in a blood vessel, another procedure may be done to treat the problem.  Tell your health care provider if you develop chest pain or trouble breathing during the procedure.  When the images are finished, the catheter will be removed. Pressure will be applied to stop bleeding.  A closure device may be placed into the access site to form a seal. The seal stops bleeding and helps the artery heal.  A bandage (dressing)will be applied over the small opening in the skin.  Your IV will be removed. The procedure may vary among health care providers and hospitals. What happens after the procedure?  Your blood pressure, heart rate, breathing rate, and blood oxygen level will be monitored until you leave the hospital or clinic.  You will be asked to lie flat for several hours. You will keep the limb where the catheter was inserted straight.  The insertion site and the pulse in your foot or wrist will be checked often.  You will be told to drink plenty of fluids. This will help flush the contrast dye out of your system.  Do not drive for 24 hours if you were given a sedative during your procedure.  It is up to you to get the results of your procedure. Ask your health care provider, or the department that is doing the procedure, when your results will be ready. Summary  A cerebral angiogram is a procedure that checks the health of the blood vessels in the brain and neck.  You will be given a sedativeand a local anesthetic. You may feel pressure when the catheter is inserted and warmth when the dye is injected.  Contrast dye is injected through a catheter into an artery. X-rays are taken to look for an abnormality, such as blockage or narrowing.  After the procedure, you will be  asked to lie flat for several hours. Do not drive for 24 hours, or until a health care provider tells you to. This information is not intended to replace advice given to you by your health care provider. Make sure you discuss any questions you have with your health care provider. Document Revised: 09/30/2018 Document Reviewed: 09/30/2018 Elsevier Patient Education  [place]. Moderate Conscious Sedation, Adult Sedation is the use of medicines to promote relaxation and relieve discomfort and anxiety. Moderate conscious sedation is a type of sedation.  Under moderate conscious sedation, you are less alert than normal, but you are still able to respond to instructions, touch, or both. Moderate conscious sedation is used during short medical and dental procedures. It is milder than deep sedation, which is a type of sedation under which you cannot be easily woken up. It is also milder than general anesthesia, which is the use of medicines to make you unconscious. Moderate conscious sedation allows you to return to your regular activities sooner. Tell a health care provider about:  Any allergies you have.  All medicines you are taking, including vitamins, herbs, eye drops, creams, and over-the-counter medicines.  Use of steroids (by mouth or creams).  Any problems you or family members have had with sedatives and anesthetic medicines.  Any blood disorders you have.  Any surgeries you have had.  Any medical conditions you have, such as sleep apnea.  Whether you are pregnant or may be pregnant.  Any use of cigarettes, alcohol, marijuana, or street drugs. What are the risks? Generally, this is a safe procedure. However, problems may occur, including:  Getting too much medicine (oversedation).  Nausea.  Allergic reaction to medicines.  Trouble breathing. If this happens, a breathing tube may be used to help with breathing. It will be removed when you are awake and breathing on your  own.  Heart trouble.  Lung trouble. What happens before the procedure? Staying hydrated Follow instructions from your health care provider about hydration, which may include:  Up to 2 hours before the procedure - you may continue to drink clear liquids, such as water, clear fruit juice, black coffee, and plain tea. Eating and drinking restrictions Follow instructions from your health care provider about eating and drinking, which may include:  8 hours before the procedure - stop eating heavy meals or foods such as meat, fried foods, or fatty foods.  6 hours before the procedure - stop eating light meals or foods, such as toast or cereal.  6 hours before the procedure - stop drinking milk or drinks that contain milk.  2 hours before the procedure - stop drinking clear liquids. Medicine Ask your health care provider about:  Changing or stopping your regular medicines. This is especially important if you are taking diabetes medicines or blood thinners.  Taking medicines such as aspirin and ibuprofen. These medicines can thin your blood. Do not take these medicines before your procedure if your health care provider instructs you not to.  Tests and exams  You will have a physical exam.  You may have blood tests done to show: ? How well your kidneys and liver are working. ? How well your blood can clot. General instructions  Plan to have someone take you home from the hospital or clinic.  If you will be going home right after the procedure, plan to have someone with you for 24 hours. What happens during the procedure?  An IV tube will be inserted into one of your veins.  Medicine to help you relax (sedative) will be given through the IV tube.  The medical or dental procedure will be performed. What happens after the procedure?  Your blood pressure, heart rate, breathing rate, and blood oxygen level will be monitored often until the medicines you were given have worn  off.  Do not drive for 24 hours. This information is not intended to replace advice given to you by your health care provider. Make sure you discuss any questions you have with your health care provider.  Document Revised: 02/22/2017 Document Reviewed: 07/02/2015 Elsevier Patient Education  2020 [employer].

## 2019-03-31 NOTE — Sedation Documentation (Signed)
ETCO2 removed per Dr. Deveshwar 

## 2019-03-31 NOTE — Sedation Documentation (Signed)
5 Fr. Exoseal to right groin 

## 2019-03-31 NOTE — Procedures (Signed)
S/P 4 vessel and RT ECA arteriograms . RT CFA approach. Findings. 1.Fast flow RT tentorial AV shunting superior to the RT TS fed by RT PCA P3 P4 branch with single cortical drainage into the mid superior sagittal sinus. S.Avaline Stillson MD

## 2019-03-31 NOTE — Progress Notes (Signed)
Ambulated in hallway and to the bathroom to void. Tol well. No bleeding noted from groin site before or after ambulation.

## 2019-03-31 NOTE — H&P (Signed)
Chief Complaint: Patient was seen in consultation today for cerebral arteriogram at the request of Dr Lavera Guise   Supervising Physician: Luanne Bras  Patient Status: [facility] - Out-pt  History of Present Illness: [name] [name] is a 44 y.o. female   Acute onset of headaches and left homonymous hemianopsia. 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 Persistent attenuation of the caliber of the mid transverse sinus. This may represent a true right mid transverse sinus stenosis versus extrinsic compression related to the right occipital hemorrhage felt to be less likely though. PLAN: Findings reviewed with the patient, the spouse and the referring neurologist. Plan on repeating diagnostic catheter arteriogram in approximately 4-6 weeks time.  Pt still with crackling noises and pulsatile tinnitus Rt side Denies dizzy; N/V Denies speech or visual changes  Scheduled now for follow up cerebral arteriogram   Past Medical History:  Diagnosis Date  . ICH (intracerebral hemorrhage) ([place]) 02/21/2019    Past Surgical History:  Procedure Laterality Date  . CESAREAN SECTION    . IR ANGIO INTRA EXTRACRAN SEL COM CAROTID INNOMINATE BILAT MOD SED  02/23/2019  . IR ANGIO VERTEBRAL SEL VERTEBRAL BILAT MOD SED  02/23/2019    Allergies: Other  Medications: Prior to Admission medications   Medication Sig Start Date End Date Taking? Authorizing Provider  atorvastatin (LIPITOR) 10 MG tablet Take 1 tablet (10 mg total) by mouth daily. 02/24/19 02/24/20 Yes Donzetta Starch, NP  hydrOXYzine (ATARAX/VISTARIL) 25 MG tablet Take 25 mg by mouth every 8 (eight) hours as needed. 03/03/19  Yes [provider]  sertraline (ZOLOFT) 100 MG tablet Take 150 mg by mouth daily.  11/26/18  Yes [provider]      Family History  Problem Relation Age of Onset  . Hypertension Mother   . Hypertension Father   . Diabetes Father     Social History   Socioeconomic History  . Marital status: Married    Spouse name: Not on file  . Number of children: Not on file  . Years of education: Not on file  . Highest education level: Not on file  Occupational History  . Not on file  Tobacco Use  . Smoking status: Never Smoker  . Smokeless tobacco: Never Used  Substance and Sexual Activity  . Alcohol use: Never  . Drug use: Never  . Sexual activity: Not on file  Other Topics Concern  . Not on file  Social History Narrative  . Not on file   Social Determinants of Health   Financial Resource Strain:   . Difficulty of Paying Living Expenses: Not on file  Food Insecurity:   . Worried About Charity fundraiser in the Last Year: Not on file  . Ran Out of Food in the Last Year: Not on file  Transportation Needs: No Transportation Needs  . Lack of Transportation (Medical): No  . Lack of Transportation (Non-Medical): No  Physical Activity:   . Days of Exercise per Week: Not on file  . Minutes of Exercise per Session: Not on file  Stress:   . Feeling of Stress : Not on file  Social Connections:   . Frequency of Communication with Friends and Family: Not on file  . Frequency of Social Gatherings with Friends and Family: Not on file  . Attends Religious Services: Not on file  . Active  Member of Clubs or Organizations: Not on file  . Attends Archivist Meetings: Not on file  . Marital Status: Not on file    Review of Systems: A 12 point ROS discussed and pertinent positives are indicated in the HPI above.  All other systems are negative.  Review of Systems  Constitutional: Negative for activity change, fatigue, fever and unexpected weight change.  HENT: Positive for tinnitus. Negative for sore throat, trouble swallowing and voice change.   Eyes: Negative for visual disturbance.   Respiratory: Negative for cough and shortness of breath.   Cardiovascular: Negative for chest pain.  Gastrointestinal: Negative for abdominal pain and nausea.  Musculoskeletal: Negative for back pain, gait problem and neck pain.  Neurological: Positive for headaches. Negative for dizziness, tremors, seizures, syncope, facial asymmetry, speech difficulty, weakness, light-headedness and numbness.  Psychiatric/Behavioral: Negative for behavioral problems, confusion and decreased concentration.    Vital Signs: BP 112/74   Pulse 75   Temp 97.7 F (36.5 C) (Oral)   Resp 16   Ht 5\' 7"  (1.702 m)   Wt 168 lb (76.2 kg)   LMP 03/19/2019 (Exact Date)   BMI 26.31 kg/m   Physical Exam Vitals reviewed.  HENT:     Head: Atraumatic.     Mouth/Throat:     Mouth: Mucous membranes are moist.  Eyes:     Extraocular Movements: Extraocular movements intact.  Cardiovascular:     Rate and Rhythm: Normal rate and regular rhythm.     Heart sounds: Normal heart sounds.  Pulmonary:     Effort: Pulmonary effort is normal.     Breath sounds: Normal breath sounds.  Abdominal:     Palpations: Abdomen is soft.     Tenderness: There is no abdominal tenderness.  Musculoskeletal:        General: Normal range of motion.     Cervical back: Normal range of motion.     Right lower leg: No edema.     Left lower leg: No edema.  Skin:    General: Skin is warm and dry.  Neurological:     Mental Status: She is alert and oriented to person, place, and time.  Psychiatric:        Mood and Affect: Mood normal.        Behavior: Behavior normal.        Thought Content: Thought content normal.        Judgment: Judgment normal.     Imaging: No results found.  Labs:  CBC: Recent Labs    02/21/19 1003 02/22/19 0400 02/23/19 0345 03/31/19 0700  WBC 7.1 10.9* 8.0 7.4  HGB 14.3 14.1 14.1 13.4  HCT 43.8 41.9 41.9 40.6  PLT 248 266 245 244    COAGS: Recent Labs    02/21/19 1003  INR 1.0  APTT 27     BMP: Recent Labs    02/21/19 1003 02/22/19 0400 02/23/19 0345  NA 137 138 137  K 3.5 3.8 3.9  CL 105 105 102  CO2 24 23 26   GLUCOSE 108* 109* 96  BUN 13 11 12   CALCIUM 8.9 8.8* 8.8*  CREATININE 0.72 0.72 0.82  GFRNONAA >60 >60 >60  GFRAA >60 >60 >60    LIVER FUNCTION TESTS: Recent Labs    02/21/19 1003  BILITOT 0.5  AST 20  ALT 21  ALKPHOS 49  PROT 7.5  ALBUMIN 3.9    TUMOR MARKERS: No results for input(s): AFPTM, CEA, CA199, CHROMGRNA in the last 8760  hours.  Assessment and Plan:  Previous ICH 02/21/19 Left homonymous hemianopsia.  Arteriogram 02/23/19: revealed Angiographically no evidence of arteriovenous shunting, or of AVM, or of aneurysm or dissection seen intracranially. Persistent attenuation of the caliber of the mid transverse sinus. This may represent a true right mid transverse sinus stenosis versus extrinsic compression related to the right occipital hemorrhage felt to be less likely though. Now scheduled for follow up arteriogram Risks and benefits of cerebral angiogram with intervention were discussed with the patient including, but not limited to bleeding, infection, vascular injury, contrast induced renal failure, stroke or even death.  This interventional procedure involves the use of X-rays and because of the nature of the planned procedure, it is possible that we will have prolonged use of X-ray fluoroscopy.  Potential radiation risks to you include (but are not limited to) the following: - A slightly elevated risk for cancer  several years later in life. This risk is typically less than 0.5% percent. This risk is low in comparison to the normal incidence of human cancer, which is 33% for women and 50% for men according to the [place]. - Radiation induced injury can include skin redness, resembling a rash, tissue breakdown / ulcers and hair loss (which can be temporary or permanent).   The likelihood of either of these  occurring depends on the difficulty of the procedure and whether you are sensitive to radiation due to previous procedures, disease, or genetic conditions.   IF your procedure requires a prolonged use of radiation, you will be notified and given written instructions for further action.  It is your responsibility to monitor the irradiated area for the 2 weeks following the procedure and to notify your physician if you are concerned that you have suffered a radiation induced injury.    All of the patient's questions were answered, patient is agreeable to proceed.  Consent signed and in chart.  Thank you for this interesting consult.  I greatly enjoyed meeting [name] [name] and look forward to participating in their care.  A copy of this report was sent to the requesting provider on this date.  Electronically Signed: Lavonia Drafts, PA-C 03/31/2019, 7:50 AM   I spent a total of    25 Minutes in face to face in clinical consultation, greater than 50% of which was counseling/coordinating care for cerebral arteriogram

## 2019-03-31 NOTE — Progress Notes (Signed)
Discharge instructions reviewed with pt and her husband (via telephone) both voice understanding.  

## 2019-04-02 ENCOUNTER — Other Ambulatory Visit (HOSPITAL_COMMUNITY): Payer: Self-pay | Admitting: Interventional Radiology

## 2019-04-02 DIAGNOSIS — I619 Nontraumatic intracerebral hemorrhage, unspecified: Secondary | ICD-10-CM

## 2019-04-07 ENCOUNTER — Inpatient Hospital Stay: Payer: Self-pay | Admitting: Adult Health

## 2019-04-07 DIAGNOSIS — H532 Diplopia: Secondary | ICD-10-CM | POA: Diagnosis not present

## 2019-04-07 DIAGNOSIS — H52203 Unspecified astigmatism, bilateral: Secondary | ICD-10-CM | POA: Diagnosis not present

## 2019-04-11 ENCOUNTER — Ambulatory Visit
Admission: RE | Admit: 2019-04-11 | Discharge: 2019-04-11 | Disposition: A | Discharge status: Home / Self Care | Payer: BC Managed Care – PPO | Source: Ambulatory Visit | Attending: Adult Health | Admitting: Adult Health

## 2019-04-11 ENCOUNTER — Other Ambulatory Visit: Payer: Self-pay

## 2019-04-11 DIAGNOSIS — I619 Nontraumatic intracerebral hemorrhage, unspecified: Secondary | ICD-10-CM | POA: Diagnosis not present

## 2019-04-11 MED ORDER — GADOBENATE DIMEGLUMINE 529 MG/ML IV SOLN
15.0000 mL | Freq: Once | INTRAVENOUS | Status: AC | PRN
  Administered 2019-04-11: 15 mL via INTRAVENOUS

## 2019-04-15 ENCOUNTER — Telehealth: Payer: Self-pay | Admitting: *Deleted

## 2019-04-15 NOTE — Telephone Encounter (Signed)
I called pt and gave her the MRI results per JM/NP result note.  She verbalized understanding.

## 2019-04-15 NOTE — Telephone Encounter (Signed)
-----   Message from [name] Rider, NP sent at 04/13/2019  4:38 PM EST ----- Please advise patient that recent MRI did not show any underlying abnormality causing recent hemorrhage.  Noted expected evolution of prior hematoma and resolution of prior edema

## 2019-04-16 ENCOUNTER — Ambulatory Visit (HOSPITAL_COMMUNITY)
Admission: RE | Admit: 2019-04-16 | Discharge: 2019-04-16 | Disposition: A | Discharge status: Home / Self Care | Payer: BC Managed Care – PPO | Source: Ambulatory Visit | Attending: Interventional Radiology | Admitting: Interventional Radiology

## 2019-04-16 ENCOUNTER — Other Ambulatory Visit: Payer: Self-pay

## 2019-04-16 DIAGNOSIS — I619 Nontraumatic intracerebral hemorrhage, unspecified: Secondary | ICD-10-CM

## 2019-04-16 NOTE — Progress Notes (Addendum)
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  Patient underwent diagnostic angiogram 02/23/19 which showed a possible abnormality of the R mid transverse sinus. She then underwent a repeat angiogram performed ~4 weeks later on 03/31/19 which demonstrated fast flow right tentorial AV shunting superior to the right transverse since fed by the right PCA P3 P4 branch with single cortical drainage into the mid superior sagittal sinus. As patient was scheduled for MR Brain for stroke follow-up, a plan was made to reconvene after the results of the MR scan were available.   Patient presents to [facility] clinic today to discuss her sinus fistula and recent imaging.   MR Brain 04/11/19: Expected evolutionary changes of the peripheral right occipital hematoma, getting smaller with current dimension of 3.2 x 2.1 x 1.1 cm. Resolution of surrounding edema. No new or progressive finding. No convincing abnormal vessel by contrast enhanced parenchymal imaging.  She is accompanied by her husband today.  She reports overall feeling much improved at home since her stroke.  She has no residual visual deficits.  She notes the Opthalmologist found she had muscle weakness in her eye which may require glasses, however her vision and overall anatomy is preserved post stroke. She does endorse a single episode of headache, dizziness, and "whoosing" noise earlier today.  She states this has not happened prior.    Past Medical History:  Diagnosis Date  . ICH (intracerebral hemorrhage) ([place]) 02/21/2019    Past Surgical History:  Procedure Laterality Date  . CESAREAN SECTION    . IR ANGIO EXTERNAL CAROTID SEL EXT CAROTID UNI R MOD SED  03/31/2019  . IR ANGIO INTRA  EXTRACRAN SEL COM CAROTID INNOMINATE BILAT MOD SED  02/23/2019  . IR ANGIO INTRA EXTRACRAN SEL COM CAROTID INNOMINATE BILAT MOD SED  03/31/2019  . IR ANGIO VERTEBRAL SEL VERTEBRAL BILAT MOD SED  02/23/2019  . IR ANGIO VERTEBRAL SEL VERTEBRAL BILAT MOD SED  03/31/2019    Allergies: Other  Medications: Prior to Admission medications   Medication Sig Start Date End Date Taking? Authorizing Provider  atorvastatin (LIPITOR) 10 MG tablet Take 1 tablet (10 mg total) by mouth daily. 02/24/19 02/24/20  Donzetta Starch, NP  hydrOXYzine (ATARAX/VISTARIL) 25 MG tablet Take 25 mg by mouth every 8 (eight) hours as needed. 03/03/19   [provider]  sertraline (ZOLOFT) 100 MG tablet Take 150 mg by mouth daily.  11/26/18   [provider]     Family History  Problem Relation Age of Onset  . Hypertension Mother   . Hypertension Father   . Diabetes Father     Social History   Socioeconomic History  . Marital status: Married    Spouse name: Not on file  . Number of children: Not on file  . Years of education: Not on file  . Highest education level: Not on file  Occupational History  . Not on file  Tobacco Use  . Smoking status: Never Smoker  . Smokeless tobacco: Never Used  Substance and Sexual Activity  . Alcohol use: Never  . Drug use: Never  . Sexual activity: Not on file  Other Topics Concern  . Not on file  Social History Narrative  . Not on file   Social Determinants of Health  Financial Resource Strain:   . Difficulty of Paying Living Expenses: Not on file  Food Insecurity:   . Worried About Charity fundraiser in the Last Year: Not on file  . Ran Out of Food in the Last Year: Not on file  Transportation Needs: No Transportation Needs  . Lack of Transportation (Medical): No  . Lack of Transportation (Non-Medical): No  Physical Activity:   . Days of Exercise per Week: Not on file  . Minutes of Exercise per Session: Not on file  Stress:   . Feeling of Stress :  Not on file  Social Connections:   . Frequency of Communication with Friends and Family: Not on file  . Frequency of Social Gatherings with Friends and Family: Not on file  . Attends Religious Services: Not on file  . Active Member of Clubs or Organizations: Not on file  . Attends Archivist Meetings: Not on file  . Marital Status: Not on file     Vital Signs: LMP 03/19/2019 (Exact Date)   Physical Exam  NAD, alert Neuro: EOMs intact.  PERRL. Facial symmetry. Tongue midline.  Speech intelligible.  Moving all extremities.  No gait abnormality.   Imaging: No results found.  Labs:  CBC: Recent Labs    02/21/19 1003 02/22/19 0400 02/23/19 0345 03/31/19 0700  WBC 7.1 10.9* 8.0 7.4  HGB 14.3 14.1 14.1 13.4  HCT 43.8 41.9 41.9 40.6  PLT 248 266 245 244    COAGS: Recent Labs    02/21/19 1003 03/31/19 0700  INR 1.0 1.0  APTT 27  --     BMP: Recent Labs    02/21/19 1003 02/22/19 0400 02/23/19 0345 03/31/19 0700  NA 137 138 137 139  K 3.5 3.8 3.9 3.6  CL 105 105 102 104  CO2 24 23 26 28   GLUCOSE 108* 109* 96 91  BUN 13 11 12 11   CALCIUM 8.9 8.8* 8.8* 9.1  CREATININE 0.72 0.72 0.82 0.68  GFRNONAA >60 >60 >60 >60  GFRAA >60 >60 >60 >60    LIVER FUNCTION TESTS: Recent Labs    02/21/19 1003  BILITOT 0.5  AST 20  ALT 21  ALKPHOS 49  PROT 7.5  ALBUMIN 3.9    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  Patient is seen in follow-up today alongside Dr. Estanislado Pandy.  She has been doing well at home and continues to see improvement in her vision.  She has been assessed by an opthalmalogist with possible plans for eye muscle strengthening.  She complains of a single headache accompanied by whoosing and a fleeting sense of dizziness today.   Dr. Estanislado Pandy discussed imaging findings with [name] [name] and her husband today.   Described how her current shunting abnormality may be contributing to her current, intermittent, short-lived symptoms.   Plan made to repeat MRI/MRV Brain in 3-4 months with plans for intervention shortly after as long as her stroke continues to heal as expected.  The patient is agreeable and very interested in proceeding with intervention as soon as reasonable. She understands the risks of the procedure including the 1-2% risk of stroke. She should plan to initiate 81mg  aspirin 1 week prior to her intervention.  She understands this will be arranged and that she will be contact by schedulers for confirmation and planning.   Signed: Docia Barrier, PA 04/16/2019, 1:58 PM   I spent  a total of 20 minutes in face to face in clinical consultation, greater than 50% of which was counseling/coordinating care for dural AV fistula.

## 2019-05-06 ENCOUNTER — Other Ambulatory Visit: Payer: Self-pay

## 2019-05-06 ENCOUNTER — Ambulatory Visit: Payer: BC Managed Care – PPO | Admitting: Adult Health

## 2019-05-06 ENCOUNTER — Encounter: Payer: Self-pay | Admitting: Adult Health

## 2019-05-06 VITALS — BP 113/75 | HR 67 | Temp 97.6°F | Ht 67.0 in | Wt 171.4 lb

## 2019-05-06 DIAGNOSIS — I611 Nontraumatic intracerebral hemorrhage in hemisphere, cortical: Secondary | ICD-10-CM

## 2019-05-06 DIAGNOSIS — I671 Cerebral aneurysm, nonruptured: Secondary | ICD-10-CM

## 2019-05-06 DIAGNOSIS — E785 Hyperlipidemia, unspecified: Secondary | ICD-10-CM | POA: Diagnosis not present

## 2019-05-06 NOTE — Patient Instructions (Signed)
Continue atorvastatin for secondary stroke prevention  Continue to follow up with PCP regarding cholesterol and blood pressure management   Continue to follow up with Dr. Estanislado Pandy for repeat imaging and follow up  Continue to monitor blood pressure at home  Maintain strict control of hypertension with blood pressure goal below 130/90, diabetes with hemoglobin A1c goal below 6.5% and cholesterol with LDL cholesterol (bad cholesterol) goal below 70 mg/dL. I also advised the patient to eat a healthy diet with plenty of whole grains, cereals, fruits and vegetables, exercise regularly and maintain ideal body weight.  Followup in the future with me in 6 months or call earlier if needed       Thank you for coming to see [place] at [facility]. I hope we have been able to provide you high quality care today.  You may receive a patient satisfaction survey over the next few weeks. We would appreciate your feedback and comments so that we may continue to improve ourselves and the health of our patients.

## 2019-05-06 NOTE — Progress Notes (Signed)
[facility] [address] [place] [address] [phone]       STROKE FOLLOW UP NOTE  Ms. Alveda Reasons Date of Birth:  1975/10/13 Medical Record Number:  [id number]   Reason for Referral:  stroke follow up    CHIEF COMPLAINT:  Chief Complaint  Patient presents with  . Follow-up    2 mon f/u. Husband present. Rm 9. Patient mentioned that she has been having moments of the vision in her right eye going blurry that comes and goes. She is still having the clicking in her right ear.     HPI: Stroke admission 02/21/2019: Ms.Elly P Sylvesteris a 44 y.o.femalewith history of migraine headaches and depression presented on 02/21/2019 to [facility] with C/o of vision loss and HA. Stroke work-up revealed small acute right occipital ICH on CT head and MRI brain secondary to unknown cause. Shedid not receive IV t-PA due to [place]. MRI brain w/wo did not show any underlying abnormalities such as mass, infarct or venous thrombosis.  MRV negative for CVT.  CTA head/neck unremarkable without evidence of AVM, aneurysm or vasospasm.  2D echo showed an EF of 60 to 65%.  Cerebral angiogram showed possible attenuated caliber of the right mid transverse sinus but no findings associated with current hemorrhage.  SARS coronavirus 2 negative.  No prior antithrombotic use and did not recommend initiating due to hemorrhage.  No prior history or evidence of HTN.  LDL 112 and recommend initiating low-dose atorvastatin at discharge.  No evidence or history of DM with A1c 4.8.  History of migraines/tension headache with use of Excedrin as needed PTA.  Other stroke risk factors include OCP use and advise discontinuing.  Discharged home in stable condition with recommendation of outpatient OT and recommended repeating cerebral angiogram in 6 weeks.  Initial visit 03/16/2019: Ms. Wiggers is a 44 year old female who is being seen today for hospital follow-up.  Residual deficits of difficulty  reading such as difficulty focusing on letters or numbers and mild frontal headaches.  She does endorse improvement of vision and attempted to participate in outpatient OT per patient, therapy was on hold due to "shortstaffed" at neuro rehab.  She has not received a phone call since that time to schedule visit.  She also experienced blurriness left eye upper vision while watching TV that lasted only short duration accompanied by mild tension headache throughout the day.  She has not experienced any recurrent blurriness sensation.  She does have initial evaluation with ophthalmology on 04/06/2018 with hopes of returning to driving after that appointment.  Underlying history of migraines which she has not experienced recently but does have frequent tension type headaches along the frontal portion of her head but patient believes this is due to increased stress and anxiety.  Underlying history of anxiety with recent dose increase of sertraline from 100 mg daily to 150 mg daily along with initiating hydroxyzine 25 mg nightly as needed.  She does endorse improvement.  Husband did call into office last week regarding patient complaining of bilateral "ear cracking sound" along with right ear pulsating.  She does endorse history of "jaw cracking" and pain over the past 3 to 6 months months also accompanied by "ear cracking sound".  Husband endorses grinding of teeth at night.  Right ear pulsating sensation has been present since her stroke.  Typically only present when she lays down or when she is using her cell phone on the left side.  She denies any decreased hearing.  She  does have history of reconstructive therapy on her jaw at age 44. she is scheduled to repeat cerebral angiogram on 03/31/2019.  She has continued on atorvastatin without myalgias.  Blood pressure satisfactory at 115/78.  She does express concerns regarding unknown etiology of ICH and fear of reoccurring ICH.  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  No further concerns at this time.  Update 2/10/20201: Ms. Malan is a 44 year old female who is being seen today for follow up.  She continues to experience whooshing sensation and clicking sensation in her right ear and over the past week, has been experiencing intermittent short episodes of blurred vision.  Follow-up with Dr. 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  MRI w/wo contrast obtained on 04/11/2019 which did not show underlying abnormalities with expected evolutionary changes of right occipital hematoma and resolution of surrounding edema.  Plans on repeating MRI/MRV brain 3 to 4 months with plans for intervention if remains stable.  She was evaluated by ophthalmology with finding of left eye muscle weakness and may proceed with intervention when she is stable from a neurological/vascular standpoint.  Continues on atorvastatin without myalgias.  Blood pressure today stable at 113/75.  No further concerns at this time.          ROS:   14 system review of systems performed and negative with exception of intermittent blurred vision and tinnitus  PMH:  Past Medical History:  Diagnosis Date  . ICH (intracerebral hemorrhage) ([place]) 02/21/2019    PSH:  Past Surgical History:  Procedure Laterality Date  . CESAREAN SECTION    . IR ANGIO EXTERNAL CAROTID SEL EXT CAROTID UNI R MOD SED  03/31/2019  . IR ANGIO INTRA EXTRACRAN SEL COM CAROTID INNOMINATE BILAT MOD SED  02/23/2019  . IR ANGIO INTRA EXTRACRAN SEL COM CAROTID INNOMINATE BILAT MOD SED  03/31/2019  . IR ANGIO VERTEBRAL SEL VERTEBRAL BILAT MOD SED  02/23/2019  . IR ANGIO VERTEBRAL SEL VERTEBRAL BILAT MOD SED  03/31/2019    Social History:  Social History   Socioeconomic History  .  Marital status: Married    Spouse name: Not on file  . Number of children: Not on file  . Years of education: Not on file  . Highest education level: Not on file  Occupational History  . Not on file  Tobacco Use  . Smoking status: Never Smoker  . Smokeless tobacco: Never Used  Substance and Sexual Activity  . Alcohol use: Never  . Drug use: Never  . Sexual activity: Not on file  Other Topics Concern  . Not on file  Social History Narrative  . Not on file   Social Determinants of Health   Financial Resource Strain:   . Difficulty of Paying Living Expenses: Not on file  Food Insecurity:   . Worried About Charity fundraiser in the Last Year: Not on file  . Ran Out of Food in the Last Year: Not on file  Transportation Needs: No Transportation Needs  . Lack of Transportation (Medical): No  . Lack of Transportation (Non-Medical): No  Physical Activity:   . Days of Exercise per Week: Not on file  . Minutes of Exercise per Session: Not on file  Stress:   . Feeling of Stress :  Not on file  Social Connections:   . Frequency of Communication with Friends and Family: Not on file  . Frequency of Social Gatherings with Friends and Family: Not on file  . Attends Religious Services: Not on file  . Active Member of Clubs or Organizations: Not on file  . Attends Archivist Meetings: Not on file  . Marital Status: Not on file  Intimate Partner Violence:   . Fear of Current or Ex-Partner: Not on file  . Emotionally Abused: Not on file  . Physically Abused: Not on file  . Sexually Abused: Not on file    Family History:  Family History  Problem Relation Age of Onset  . Hypertension Mother   . Hypertension Father   . Diabetes Father     Medications:   Current Outpatient Medications on File Prior to Visit  Medication Sig Dispense Refill  . atorvastatin (LIPITOR) 10 MG tablet Take 1 tablet (10 mg total) by mouth daily. 30 tablet 2  . hydrOXYzine (ATARAX/VISTARIL) 25 MG  tablet Take 25 mg by mouth every 8 (eight) hours as needed.    . sertraline (ZOLOFT) 100 MG tablet Take 150 mg by mouth daily.      No current facility-administered medications on file prior to visit.    Allergies:   Allergies  Allergen Reactions  . Other     "berries"     Physical Exam  Vitals:   05/06/19 1237  BP: 113/75  Pulse: 67  Temp: 97.6 F (36.4 C)  TempSrc: Oral  Weight: 171 lb 6.4 oz (77.7 kg)  Height: 5\' 7"  (1.702 m)   Body mass index is 26.85 kg/m. No exam data present  General: well developed, well nourished,  pleasant middle-age Caucasian female, seated, in no evident distress Head: head normocephalic and atraumatic.   Neck: supple with no carotid or supraclavicular bruits Cardiovascular: regular rate and rhythm, no murmurs Musculoskeletal: no deformity Skin:  no rash/petichiae Vascular:  Normal pulses all extremities   Neurologic Exam Mental Status: Awake and fully alert. Oriented to place and time. Recent and remote memory intact. Attention span, concentration and fund of knowledge appropriate. Mood and affect appropriate.  Cranial Nerves: Pupils equal, briskly reactive to light. Extraocular movements full without nystagmus.  Evidence of left eye convergence insufficiency.  Visual fields full to confrontation. Hearing intact. Facial sensation intact. Face, tongue, palate moves normally and symmetrically.  Motor: Normal bulk and tone. Normal strength in all tested extremity muscles. Sensory.: intact to touch , pinprick , position and vibratory sensation.  Coordination: Rapid alternating movements normal in all extremities. Finger-to-nose and heel-to-shin performed accurately bilaterally. Gait and Station: Arises from chair without difficulty. Stance is normal. Gait demonstrates normal stride length and balance Reflexes: 1+ and symmetric. Toes downgoing.      Diagnostic Data (Labs, Imaging, Testing) diagnostic angiogram  02/23/19 which showed a  possible abnormality of the R mid transverse sinus. She then underwent a repeat angiogram performed ~4 weeks later on 03/31/19 which demonstrated fast flow right tentorial AV shunting superior to the right transverse since fed by the right PCA P3 P4 branch with single cortical drainage into the mid superior sagittal sinus.  MR Brain  04/11/19: Expected evolutionary changes of the peripheral right occipital hematoma, getting smaller with current dimension of 3.2 x 2.1 x 1.1 cm. Resolution of surrounding edema. No new or progressive finding. No convincing abnormal vessel by contrast enhanced parenchymal imaging.  Cerebral angio 01/23/2019 1.Attenuated caliber of RT mid transverse sinus?  Underlying stenosis.. Otherwise negative for aneurysm or ,AV shunt Or AVM .  Ct Angio Head W Or Wo Contrast Ct Angio Neck W And/or Wo Contrast 02/21/2019 1. 2.9 cm acute right occipital intraparenchymal hemorrhage.  2. Patent Circle of Willis without major branch occlusion, significant proximal stenosis, aneurysm, or vascular malformation.  3. Widely patent cervical carotid and vertebral arteries.   Mr Jeri Cos Wo Contrast Mr Mrv Miguel Dibble Wo Contrast 02/21/2019 Known right occipital hematoma with similar dimensions to prior head CT. An underlying cause is not seen, including mass, infarct, or venous thrombosis.   Transthoracic Echocardiogram  1. Left ventricular ejection fraction, by visual estimation, is 60 to 65%. The left ventricle has normal function. There is no left ventricular hypertrophy. 2. Global right ventricle has normal systolic function.The right ventricular size is normal. No increase in right ventricular wall thickness. 3. Left atrial size was normal. 4. Right atrial size was normal. 5. Mild mitral annular calcification. 6. The mitral valve is normal in structure. Trace mitral valve regurgitation. No evidence of mitral stenosis. 7. The tricuspid valve is normal in structure.  Tricuspid valve regurgitation is mild. 8. The aortic valve is tricuspid. Aortic valve regurgitation is not visualized. Mild aortic valve sclerosis without stenosis. 9. The pulmonic valve was normal in structure. Pulmonic valve regurgitation is not visualized. 10. Normal pulmonary artery systolic pressure. 11. The inferior vena cava is normal in size with greater than 50% respiratory variability, suggesting right atrial pressure of 3 mmHg.  ECG - SR rate 83 BPM. (See cardiology reading for complete details)    ASSESSMENT: Norinne P Hanselman is a 44 y.o. year old female presented with complaints of vision loss and headache on 02/21/2019 with stroke work-up revealing small acute right occipital ICH secondary to unknown cause with MRI w/wo, MRV and cerebral angiogram all unremarkable without evidence of mass, infarct, venous thrombosis, AVM, aneurysm or vasospasm. Vascular risk factors include HLD and migraine headaches.  Repeat cerebral angiogram with Dr. Estanislado Pandy showed evidence of dural AV fistula likely cause of hemorrhagic stroke with plans on possible intervention after repeat imaging in approximately 3 months.  Ongoing deficits include tinnitus of right ear and intermittent blurriness.   PLAN:  1. Right occipital ICH: Advised to avoid aspirin, aspirin-containing products or ibuprofen products due to hemorrhage unless instructed otherwise.  Continue atorvastatin for secondary stroke prevention. Maintain strict control of hypertension with blood pressure goal below 130/90, diabetes with hemoglobin A1c goal below 6.5% and cholesterol with LDL cholesterol (bad cholesterol) goal below 70 mg/dL.  I also advised the patient to eat a healthy diet with plenty of whole grains, cereals, fruits and vegetables, exercise regularly with at least 30 minutes of continuous activity daily and maintain ideal body weight. 2. HLD: Advised to continue current treatment regimen along with continued follow-up with PCP  for future prescribing and monitoring of lipid panel 3. Dural AV fistula: Right occipital ICH etiology.  Advised tinnitus and visual concerns are likely secondary to AV fistula and may improve with potential future intervention.  Advised to continue to follow with Dr. Estanislado Pandy as scheduled   Follow up in 6 months or call earlier if needed   Greater than 50% of time during this 30 minute visit was spent on counseling, explanation of diagnosis of right occipital ICH, reviewing discussion regarding cerebral angiograms and finding of dural AV fistula and possible symptoms, reviewing risk factor management of HLD and migraines, discussion regarding residual deficits, planning of further management along with potential future  management, discussion regarding stroke/TIA signs and symptoms and to call 911 immediately with any concerns and discussion with patient and family answering all questions.    Frann Rider, AGNP-BC  [facility] [address] [place], [place] [address]  Phone [phone] Fax [phone] Note: This document was prepared with digital dictation and possible smart phrase technology. Any transcriptional errors that result from this process are unintentional.

## 2019-05-06 NOTE — Progress Notes (Signed)
I agree with the above plan 

## 2019-05-08 ENCOUNTER — Telehealth (HOSPITAL_COMMUNITY): Payer: Self-pay | Admitting: Radiology

## 2019-05-08 NOTE — Telephone Encounter (Signed)
Pt called and requested that I relay to a PA (sent inbox to [facility]) that she has had a change in vision. Her Rt eye is going in and out of blurriness. Passed message to PA. Velora Mediate

## 2019-05-21 ENCOUNTER — Other Ambulatory Visit (HOSPITAL_COMMUNITY): Payer: Self-pay | Admitting: Interventional Radiology

## 2019-05-21 ENCOUNTER — Ambulatory Visit: Payer: BC Managed Care – PPO | Admitting: Adult Health

## 2019-05-21 DIAGNOSIS — H538 Other visual disturbances: Secondary | ICD-10-CM

## 2019-05-21 DIAGNOSIS — I619 Nontraumatic intracerebral hemorrhage, unspecified: Secondary | ICD-10-CM

## 2019-05-22 ENCOUNTER — Ambulatory Visit (HOSPITAL_COMMUNITY)
Admission: RE | Admit: 2019-05-22 | Discharge: 2019-05-22 | Disposition: A | Discharge status: Home / Self Care | Payer: BC Managed Care – PPO | Source: Ambulatory Visit | Attending: Interventional Radiology | Admitting: Interventional Radiology

## 2019-05-22 ENCOUNTER — Other Ambulatory Visit: Payer: Self-pay

## 2019-05-22 ENCOUNTER — Ambulatory Visit (HOSPITAL_COMMUNITY): Payer: BC Managed Care – PPO

## 2019-05-22 DIAGNOSIS — I619 Nontraumatic intracerebral hemorrhage, unspecified: Secondary | ICD-10-CM

## 2019-05-22 DIAGNOSIS — H538 Other visual disturbances: Secondary | ICD-10-CM | POA: Diagnosis not present

## 2019-05-23 ENCOUNTER — Ambulatory Visit: Payer: BC Managed Care – PPO | Attending: Internal Medicine

## 2019-05-23 DIAGNOSIS — Z23 Encounter for immunization: Secondary | ICD-10-CM | POA: Insufficient documentation

## 2019-05-23 NOTE — Progress Notes (Signed)
   Covid-19 Vaccination Clinic  Name:  [name] [name]    MRN: [id number] DOB: [birth year]  05/23/2019  [name] [name] was observed post Covid-19 immunization for 15 minutes without incidence. She was provided with Vaccine Information Sheet and instruction to access the V-Safe system.   [name] [name] was instructed to call 911 with any severe reactions post vaccine: Marland Kitchen Difficulty breathing  . Swelling of your face and throat  . A fast heartbeat  . A bad rash all over your body  . Dizziness and weakness    Immunizations Administered    Name Date Dose VIS Date Route   Pfizer COVID-19 Vaccine 05/23/2019  2:43 PM 0.3 mL 03/06/2019 Intramuscular   Manufacturer: [place]   Lot: [id number]   [place]: [id number]

## 2019-05-26 ENCOUNTER — Telehealth (HOSPITAL_COMMUNITY): Payer: Self-pay

## 2019-05-26 NOTE — Telephone Encounter (Signed)
Pt agreed to f/u in 4 months with mri/mra wo. AW

## 2019-05-28 ENCOUNTER — Ambulatory Visit (HOSPITAL_COMMUNITY): Payer: BC Managed Care – PPO

## 2019-06-05 DIAGNOSIS — F5101 Primary insomnia: Secondary | ICD-10-CM | POA: Diagnosis not present

## 2019-06-05 DIAGNOSIS — F411 Generalized anxiety disorder: Secondary | ICD-10-CM | POA: Diagnosis not present

## 2019-06-13 ENCOUNTER — Ambulatory Visit: Payer: BC Managed Care – PPO | Attending: Internal Medicine

## 2019-06-13 DIAGNOSIS — Z23 Encounter for immunization: Secondary | ICD-10-CM

## 2019-06-13 NOTE — Progress Notes (Signed)
   Covid-19 Vaccination Clinic  Name:  [name] [name]    MRN: [id number] DOB: 06-16-1975  06/13/2019  Ms. Amuso was observed post Covid-19 immunization for 15 minutes without incident. She was provided with Vaccine Information Sheet and instruction to access the V-Safe system.   Ms. Dood was instructed to call 911 with any severe reactions post vaccine: Marland Kitchen Difficulty breathing  . Swelling of face and throat  . A fast heartbeat  . A bad rash all over body  . Dizziness and weakness   Immunizations Administered    Name Date Dose VIS Date Route   Pfizer COVID-19 Vaccine 06/13/2019  8:12 AM 0.3 mL 03/06/2019 Intramuscular   Manufacturer: Frank   Lot: [id number]   [place]: [id number]

## 2019-07-27 DIAGNOSIS — Z1389 Encounter for screening for other disorder: Secondary | ICD-10-CM | POA: Diagnosis not present

## 2019-07-27 DIAGNOSIS — Z01419 Encounter for gynecological examination (general) (routine) without abnormal findings: Secondary | ICD-10-CM | POA: Diagnosis not present

## 2019-07-27 DIAGNOSIS — Z130 Encounter for screening for diseases of the blood and blood-forming organs and certain disorders involving the immune mechanism: Secondary | ICD-10-CM | POA: Diagnosis not present

## 2019-07-27 DIAGNOSIS — Z124 Encounter for screening for malignant neoplasm of cervix: Secondary | ICD-10-CM | POA: Diagnosis not present

## 2019-07-27 DIAGNOSIS — Z1231 Encounter for screening mammogram for malignant neoplasm of breast: Secondary | ICD-10-CM | POA: Diagnosis not present

## 2019-07-31 ENCOUNTER — Other Ambulatory Visit: Payer: Self-pay | Admitting: Obstetrics and Gynecology

## 2019-07-31 DIAGNOSIS — R928 Other abnormal and inconclusive findings on diagnostic imaging of breast: Secondary | ICD-10-CM

## 2019-08-04 ENCOUNTER — Other Ambulatory Visit: Payer: Self-pay | Admitting: Obstetrics and Gynecology

## 2019-08-04 ENCOUNTER — Other Ambulatory Visit: Payer: Self-pay

## 2019-08-04 ENCOUNTER — Ambulatory Visit
Admission: RE | Admit: 2019-08-04 | Discharge: 2019-08-04 | Disposition: A | Discharge status: Home / Self Care | Payer: BC Managed Care – PPO | Source: Ambulatory Visit | Attending: Obstetrics and Gynecology | Admitting: Obstetrics and Gynecology

## 2019-08-04 DIAGNOSIS — R928 Other abnormal and inconclusive findings on diagnostic imaging of breast: Secondary | ICD-10-CM

## 2019-08-04 DIAGNOSIS — R922 Inconclusive mammogram: Secondary | ICD-10-CM | POA: Diagnosis not present

## 2019-08-04 DIAGNOSIS — R921 Mammographic calcification found on diagnostic imaging of breast: Secondary | ICD-10-CM

## 2019-08-11 ENCOUNTER — Other Ambulatory Visit: Payer: Self-pay

## 2019-08-11 ENCOUNTER — Ambulatory Visit
Admission: RE | Admit: 2019-08-11 | Discharge: 2019-08-11 | Disposition: A | Discharge status: Home / Self Care | Payer: BC Managed Care – PPO | Source: Ambulatory Visit | Attending: Obstetrics and Gynecology | Admitting: Obstetrics and Gynecology

## 2019-08-11 ENCOUNTER — Other Ambulatory Visit (HOSPITAL_COMMUNITY): Payer: Self-pay | Admitting: Interventional Radiology

## 2019-08-11 DIAGNOSIS — R921 Mammographic calcification found on diagnostic imaging of breast: Secondary | ICD-10-CM | POA: Diagnosis not present

## 2019-08-11 DIAGNOSIS — N6489 Other specified disorders of breast: Secondary | ICD-10-CM | POA: Diagnosis not present

## 2019-08-11 DIAGNOSIS — I619 Nontraumatic intracerebral hemorrhage, unspecified: Secondary | ICD-10-CM

## 2019-08-21 DIAGNOSIS — L720 Epidermal cyst: Secondary | ICD-10-CM | POA: Diagnosis not present

## 2019-08-21 DIAGNOSIS — D225 Melanocytic nevi of trunk: Secondary | ICD-10-CM | POA: Diagnosis not present

## 2019-08-21 DIAGNOSIS — D2261 Melanocytic nevi of right upper limb, including shoulder: Secondary | ICD-10-CM | POA: Diagnosis not present

## 2019-09-01 ENCOUNTER — Other Ambulatory Visit: Payer: Self-pay | Admitting: Surgery

## 2019-09-01 DIAGNOSIS — N6081 Other benign mammary dysplasias of right breast: Secondary | ICD-10-CM | POA: Diagnosis not present

## 2019-09-03 ENCOUNTER — Ambulatory Visit (HOSPITAL_COMMUNITY)
Admission: RE | Admit: 2019-09-03 | Discharge: 2019-09-03 | Disposition: A | Discharge status: Home / Self Care | Payer: BC Managed Care – PPO | Source: Ambulatory Visit | Attending: Interventional Radiology | Admitting: Interventional Radiology

## 2019-09-03 ENCOUNTER — Other Ambulatory Visit: Payer: Self-pay

## 2019-09-03 DIAGNOSIS — I619 Nontraumatic intracerebral hemorrhage, unspecified: Secondary | ICD-10-CM

## 2019-09-03 DIAGNOSIS — I629 Nontraumatic intracranial hemorrhage, unspecified: Secondary | ICD-10-CM | POA: Diagnosis not present

## 2019-09-03 MED ORDER — GADOBUTROL 1 MMOL/ML IV SOLN
7.0000 mL | Freq: Once | INTRAVENOUS | Status: AC | PRN
  Administered 2019-09-03: 7 mL via INTRAVENOUS

## 2019-09-07 ENCOUNTER — Other Ambulatory Visit (HOSPITAL_COMMUNITY): Payer: Self-pay | Admitting: Interventional Radiology

## 2019-09-07 DIAGNOSIS — I619 Nontraumatic intracerebral hemorrhage, unspecified: Secondary | ICD-10-CM

## 2019-09-09 ENCOUNTER — Ambulatory Visit (HOSPITAL_COMMUNITY)
Admission: RE | Admit: 2019-09-09 | Discharge: 2019-09-09 | Disposition: A | Discharge status: Home / Self Care | Payer: BC Managed Care – PPO | Source: Ambulatory Visit | Attending: Interventional Radiology | Admitting: Interventional Radiology

## 2019-09-09 ENCOUNTER — Other Ambulatory Visit: Payer: Self-pay

## 2019-09-09 DIAGNOSIS — I619 Nontraumatic intracerebral hemorrhage, unspecified: Secondary | ICD-10-CM

## 2019-09-09 NOTE — Progress Notes (Addendum)
Chief Complaint: Patient was seen in consultation today for DAVF follow-up.  Referring Physician(s): Rosalin Hawking  Supervising Physician: Luanne Bras  Patient Status: [facility] - Out-pt  History of Present Illness: [name] [name] is a 44 y.o. 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 She is known to [facility] and has been followed by Dr. Estanislado Pandy since 01/2019. She first presented to our department at the request of Dr. Erlinda Hong during admission for [facility] 02/21/2019 to 02/24/2019. During that admission, she underwent an image-guided diagnostic cerebral arteriogram 02/23/2019 by Dr. Estanislado Pandy which revealed no evidence of AV shunt, AVM, aneurysm, or dissection. She then underwent a follow-up image-guided diagnostic cerebral arteriogram 03/31/2019 by Dr. 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 She then consulted with Dr. Estanislado Pandy 04/16/2019 to discuss management options. At that time, it was recommended that patient pursue conservative management for her DAVF, including routine imaging scans to monitor for changes, with plans for possible intervention following this.  Diagnostic cerebral arteriogram 02/23/2019: 1. Angiographically no evidence of arteriovenous shunting, or of AVM, or of aneurysm or dissection seen intracranially. 2. Persistent attenuation of the caliber of the mid transverse sinus. This may represent a true right mid transverse sinus stenosis versus extrinsic compression related to the right occipital hemorrhage felt to be less likely though.  Diagnostic cerebral arteriogram 03/31/2019: 1. Fast flow area of arteriovenous shunting just above the distal right transverse sinus draining via cortical vein  overlying the right parieto-occipital region into the junction of the middle and the inferior superior sagittal sinus. 2. Also possible drainage into the right transverse sinus. 3. Nonvisualization of the right anterior cerebral A1 segment may be pathologic or developmental.  MRI/MRA brain/head 09/03/2019: 1. 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 Volume loss, gliosis and hemosiderin deposition in that region. No unexpected or worsening finding. The remainder the brain is normal. 2. Intracranial MR angiography does not show any high flow vascular abnormality in the region of the right occipital hemorrhage. There are some residual blood products in the area with some T1 hyperintensity, but this does not represent high flow arterial signal.  Patient presents today for follow-up to discuss findings of recent MRI/MRA. Patient awake and alert sitting in chair. Accompanied by husband. Complains of intermittent headaches, stable since last consult. Complains of intermittent blurred vision of right eye, stable since last consult. Denies weakness, numbness/tingling, dizziness, hearing changes, tinnitus, or speech difficulty.   Past Medical History:  Diagnosis Date  . ICH (intracerebral hemorrhage) ([place]) 02/21/2019    Past Surgical History:  Procedure Laterality Date  . CESAREAN SECTION    . IR ANGIO EXTERNAL CAROTID SEL EXT CAROTID UNI R MOD SED  03/31/2019  . IR ANGIO INTRA EXTRACRAN SEL COM CAROTID INNOMINATE BILAT MOD SED  02/23/2019  . IR ANGIO INTRA EXTRACRAN SEL COM CAROTID INNOMINATE BILAT MOD SED  03/31/2019  . IR ANGIO VERTEBRAL SEL VERTEBRAL BILAT MOD SED  02/23/2019  . IR ANGIO VERTEBRAL SEL VERTEBRAL BILAT MOD SED  03/31/2019    Allergies: Other  Medications: Prior to Admission medications   Medication Sig Start Date End Date Taking? Authorizing Provider  atorvastatin (LIPITOR) 10 MG tablet Take 1 tablet (10 mg total) by mouth daily. 02/24/19  02/24/20  Donzetta Starch, NP  hydrOXYzine (ATARAX/VISTARIL) 25 MG tablet Take 25 mg by mouth every 8 (eight) hours as needed. 03/03/19   [provider]  sertraline (ZOLOFT) 100 MG tablet Take 150 mg by mouth daily.  11/26/18   [provider]     Family History  Problem Relation Age of Onset  . Hypertension Mother   . Hypertension Father   . Diabetes Father     Social History   Socioeconomic History  . Marital status: Married    Spouse name: Not on file  . Number of children: Not on file  . Years of education: Not on file  . Highest education level: Not on file  Occupational History  . Not on file  Tobacco Use  . Smoking status: Never Smoker  . Smokeless tobacco: Never Used  Vaping Use  . Vaping Use: Never used  Substance and Sexual Activity  . Alcohol use: Never  . Drug use: Never  . Sexual activity: Not on file  Other Topics Concern  . Not on file  Social History Narrative  . Not on file   Social Determinants of Health   Financial Resource Strain:   . Difficulty of Paying Living Expenses:   Food Insecurity:   . Worried About Charity fundraiser in the Last Year:   . Arboriculturist in the Last Year:   Transportation Needs: No Transportation Needs  . Lack of Transportation (Medical): No  . Lack of Transportation (Non-Medical): No  Physical Activity:   . Days of Exercise per Week:   . Minutes of Exercise per Session:   Stress:   . Feeling of Stress :   Social Connections:   . Frequency of Communication with Friends and Family:   . Frequency of Social Gatherings with Friends and Family:   . Attends Religious Services:   . Active Member of Clubs or Organizations:   . Attends Archivist Meetings:   Marland Kitchen Marital Status:      Review of Systems: A 12 point ROS discussed and pertinent positives are indicated in the HPI above.  All other systems are negative.  Review of Systems  Constitutional: Negative for chills and fever.  HENT:  Negative for hearing loss and tinnitus.   Eyes: Positive for visual disturbance.  Respiratory: Negative for shortness of breath and wheezing.   Cardiovascular: Negative for chest pain and palpitations.  Neurological: Positive for headaches. Negative for dizziness, speech difficulty, weakness and numbness.  Psychiatric/Behavioral: Negative for behavioral problems and confusion.    Vital Signs: There were no vitals taken for this visit.  Physical Exam Constitutional:      General: She is not in acute distress.    Appearance: Normal appearance.  Pulmonary:     Effort: Pulmonary effort is normal. No respiratory distress.  Skin:    General: Skin is warm and dry.  Neurological:     Mental Status: She is alert and oriented to person, place, and time.     Comments: Visual fields grossly intact.      Imaging: MR ANGIO HEAD WO CONTRAST  Result Date: 09/04/2019 CLINICAL DATA:  Nontraumatic intracranial hemorrhage. Follow-up. Dural AV fistula. EXAM: MRI HEAD WITHOUT AND WITH CONTRAST MRA HEAD WITHOUT CONTRAST TECHNIQUE: Multiplanar, multiecho pulse sequences of the brain and surrounding structures were obtained without and with intravenous contrast. Angiographic images of the head were obtained using MRA technique without contrast. CONTRAST:  71mL GADAVIST GADOBUTROL 1 MMOL/ML IV SOLN COMPARISON:  05/22/2019 FINDINGS: MRI HEAD FINDINGS Brain:  Diffusion imaging does not show any acute or subacute infarction. Abnormality affects the brainstem or cerebellum. Cerebral hemispheres show residua of an old hemorrhage in the right occipital lobe, with some encephalomalacia and hemosiderin deposition. No progressive change. The remainder of the cerebral hemispheres appear normal. No evidence of mass, recent hemorrhage, hydrocephalus or extra-axial collection. After contrast administration, no abnormal enhancement occurs. Vascular: Major vessels at the base of the brain show flow. Skull and upper cervical  spine: Negative Sinuses/Orbits: Clear/normal Other: None MRA HEAD FINDINGS Both internal carotid arteries are widely patent through the skull base and siphon regions. The anterior and middle cerebral vessels are patent without proximal stenosis, aneurysm or vascular malformation. 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 Both vertebral arteries widely patent to the basilar. No basilar stenosis. Posterior circulation branch vessels appear normal. No evidence any high flow vascular abnormality in the region of the right occipital hemorrhage. Some T2 bright blood products in the region do not represent actual blood flow. 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 Volume loss, gliosis and hemosiderin deposition in that region. No unexpected or worsening finding. The remainder the brain is normal. Intracranial MR angiography does not show any high flow vascular abnormality in the region of the right occipital hemorrhage. There are some residual blood products in the area with some T1 hyperintensity, but this does not represent high flow arterial signal. Electronically Signed   By: Nelson Chimes M.D.   On: 09/04/2019 16:49   MR BRAIN W WO CONTRAST  Result Date: 09/04/2019 CLINICAL DATA:  Nontraumatic intracranial hemorrhage. Follow-up. Dural AV fistula. EXAM: MRI HEAD WITHOUT AND WITH CONTRAST MRA HEAD WITHOUT CONTRAST TECHNIQUE: Multiplanar, multiecho pulse sequences of the brain and surrounding structures were obtained without and with intravenous contrast. Angiographic images of the head were obtained using MRA technique without contrast. CONTRAST:  55mL GADAVIST GADOBUTROL 1 MMOL/ML IV SOLN COMPARISON:  05/22/2019 FINDINGS: MRI HEAD FINDINGS Brain: Diffusion imaging does not show any acute or subacute infarction. Abnormality affects the brainstem or cerebellum. Cerebral hemispheres  show residua of an old hemorrhage in the right occipital lobe, with some encephalomalacia and hemosiderin deposition. No progressive change. The remainder of the cerebral hemispheres appear normal. No evidence of mass, recent hemorrhage, hydrocephalus or extra-axial collection. After contrast administration, no abnormal enhancement occurs. Vascular: Major vessels at the base of the brain show flow. Skull and upper cervical spine: Negative Sinuses/Orbits: Clear/normal Other: None MRA HEAD FINDINGS Both internal carotid arteries are widely patent through the skull base and siphon regions. The anterior and middle cerebral vessels are patent without proximal stenosis, aneurysm or vascular malformation. 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 Both vertebral arteries widely patent to the basilar. No basilar stenosis. Posterior circulation branch vessels appear normal. No evidence any high flow vascular abnormality in the region of the right occipital hemorrhage. Some T2 bright blood products in the region do not represent actual blood flow. 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 Volume loss, gliosis and hemosiderin deposition in that region. No unexpected or worsening finding. The remainder the brain is normal. Intracranial MR angiography does not show any high flow vascular abnormality in the region of the right occipital hemorrhage. There are some residual blood products in the area with some T1 hyperintensity, but this does not represent high flow arterial signal. Electronically  Signed   By: Nelson Chimes M.D.   On: 09/04/2019 16:49   MM CLIP PLACEMENT RIGHT  Result Date: 08/11/2019 CLINICAL DATA:  Status post stereotactic guided core needle biopsy right breast calcifications. EXAM: DIAGNOSTIC RIGHT MAMMOGRAM POST STEREOTACTIC BIOPSY COMPARISON:  Previous exam(s). FINDINGS:  Mammographic images were obtained following stereotactic guided biopsy of right breast calcifications. The biopsy marking clip is in expected position at the site of biopsy. IMPRESSION: Appropriate positioning of the coil shaped biopsy marking clip at the site of biopsy in the right breast upper outer quadrant. Final Assessment: Post Procedure Mammograms for Marker Placement Electronically Signed   By: Lovey Newcomer M.D.   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 This was found to be concordant by Dr. Lovey Newcomer, with excision recommended. Pathology results were discussed with the patient by telephone. The patient reported doing well after the biopsy with tenderness at the site. Post biopsy instructions and care were reviewed and questions were answered. The patient was encouraged to call [facility] for any additional concerns. Surgical consultation has been arranged with Dr. Nedra Hai at [facility] on September 01, 2019. Pathology results reported by Terie Purser, RN on 08/12/2019. Electronically Signed   By: Lovey Newcomer M.D.   On: 08/12/2019 13:20   Result Date: 08/12/2019 CLINICAL DATA:  Patient with indeterminate right breast calcifications. EXAM: RIGHT BREAST STEREOTACTIC CORE NEEDLE BIOPSY COMPARISON:  Previous exams. FINDINGS: The patient and I discussed the procedure of stereotactic-guided biopsy including benefits and alternatives. We discussed the high likelihood of a successful procedure. We discussed the risks of the procedure including infection, bleeding, tissue injury, clip migration, and inadequate sampling. Informed written consent was given. The usual time out protocol was performed immediately prior to the procedure. Using sterile technique  and 1% Lidocaine as local anesthetic, under stereotactic guidance, a 9 gauge vacuum assisted device was used to perform core needle biopsy of calcifications within the upper-outer right breast using a lateral approach. Specimen radiograph was performed showing calcifications. Specimens with calcifications are identified for pathology. Lesion quadrant: Upper outer quadrant At the conclusion of the procedure, a coil shaped tissue marker clip was deployed into the biopsy cavity. Follow-up 2-view mammogram was performed and dictated separately. IMPRESSION: Stereotactic-guided biopsy of right breast calcifications. No apparent complications. Electronically Signed: By: Lovey Newcomer M.D. On: 08/11/2019 08:56    Labs:  CBC: Recent Labs    02/21/19 1003 02/22/19 0400 02/23/19 0345 03/31/19 0700  WBC 7.1 10.9* 8.0 7.4  HGB 14.3 14.1 14.1 13.4  HCT 43.8 41.9 41.9 40.6  PLT 248 266 245 244    COAGS: Recent Labs    02/21/19 1003 03/31/19 0700  INR 1.0 1.0  APTT 27  --     BMP: Recent Labs    02/21/19 1003 02/22/19 0400 02/23/19 0345 03/31/19 0700  NA 137 138 137 139  K 3.5 3.8 3.9 3.6  CL 105 105 102 104  CO2 24 23 26 28   GLUCOSE 108* 109* 96 91  BUN 13 11 12 11   CALCIUM 8.9 8.8* 8.8* 9.1  CREATININE 0.72 0.72 0.82 0.68  GFRNONAA >60 >60 >60 >60  GFRAA >60 >60 >60 >60    LIVER FUNCTION TESTS: Recent Labs    02/21/19 1003  BILITOT 0.5  AST 20  ALT 21  ALKPHOS 49  PROT 7.5  ALBUMIN 3.9     Assessment and Plan:  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 Dr. Estanislado Pandy was present for consultation.  Discussed symptoms- patient's headaches/blurred vision are stable. Visual fields grossly intact- improvement since last consult.  Discussed findings of recent MRI/MRA, specifically that  her hemorrhage is almost completely resolved. Discussed DAVF- explained the best course of management for DAVF at this time is with a procedure called an image-guided diagnostic cerebral arteriogram with possible embolization. Explained procedure, including risks and benefits. Explained that if diagnostic reveals no evidence of DAVF, procedure will be aborted. Patient expresses desire to move forward with procedure. Plan for follow-up with an image-guided cerebral arteriogram with possible embolization of DAVF, first available. Informed patient that our schedulers will call her to set up this procedure- patient requests procedure in 10/2019. Instructed patient to stay hydrated by drinking plenty of water.  All questions answered and concerns addressed. Patient and husband convey understanding and agree with plan.  Thank you for this interesting consult.  I greatly enjoyed meeting Mafalda P Rabold and look forward to participating in their care.  A copy of this report was sent to the requesting provider on this date.  Electronically Signed: Earley Abide, PA-C 09/09/2019, 12:01 PM   I spent a total of 40 Minutes in face to face in clinical consultation, greater than 50% of which was counseling/coordinating care for DAVF follow-up.

## 2019-09-15 ENCOUNTER — Other Ambulatory Visit (HOSPITAL_COMMUNITY): Payer: Self-pay | Admitting: Interventional Radiology

## 2019-09-15 ENCOUNTER — Telehealth (HOSPITAL_COMMUNITY): Payer: Self-pay | Admitting: Radiology

## 2019-09-15 DIAGNOSIS — I671 Cerebral aneurysm, nonruptured: Secondary | ICD-10-CM

## 2019-09-15 NOTE — Telephone Encounter (Signed)
Called pt, left VM for her to call to schedule her DAVF embolization with Deveshwar. JM

## 2019-10-22 ENCOUNTER — Other Ambulatory Visit (HOSPITAL_COMMUNITY)
Admission: RE | Admit: 2019-10-22 | Discharge: 2019-10-22 | Disposition: A | Discharge status: Home / Self Care | Payer: BC Managed Care – PPO | Source: Ambulatory Visit | Attending: Interventional Radiology | Admitting: Interventional Radiology

## 2019-10-22 DIAGNOSIS — Z20822 Contact with and (suspected) exposure to covid-19: Secondary | ICD-10-CM | POA: Diagnosis not present

## 2019-10-22 DIAGNOSIS — Z01812 Encounter for preprocedural laboratory examination: Secondary | ICD-10-CM | POA: Diagnosis not present

## 2019-10-22 LAB — SARS CORONAVIRUS 2 (TAT 6-24 HRS): SARS Coronavirus 2: NEGATIVE

## 2019-10-23 ENCOUNTER — Other Ambulatory Visit: Payer: Self-pay | Admitting: Physician Assistant

## 2019-10-23 ENCOUNTER — Other Ambulatory Visit: Payer: Self-pay

## 2019-10-23 ENCOUNTER — Encounter (HOSPITAL_COMMUNITY): Payer: Self-pay | Admitting: Interventional Radiology

## 2019-10-23 NOTE — Progress Notes (Signed)
Spoke with pt for pre-op call. Pt denies cardiac history, HTN or Diabetes. Pt has had a intracerebral bleed. Pt is not taking Aspirin or Plavix prior to this procedure per Candiss Norse, PA.   Covid test done 10/22/19 and it's negative. Pt states she has been in quarantine since the test was done and understands that she needs to stay in quarantine until she comes to the hospital on Monday.

## 2019-10-26 ENCOUNTER — Ambulatory Visit (HOSPITAL_COMMUNITY): Payer: BC Managed Care – PPO | Admitting: Certified Registered"

## 2019-10-26 ENCOUNTER — Encounter (HOSPITAL_COMMUNITY)
Admission: RE | Disposition: A | Discharge status: Home / Self Care | Payer: Self-pay | Source: Home / Self Care | Attending: Interventional Radiology

## 2019-10-26 ENCOUNTER — Ambulatory Visit (HOSPITAL_COMMUNITY)
Admission: RE | Admit: 2019-10-26 | Discharge: 2019-10-26 | Disposition: A | Discharge status: Home / Self Care | Payer: BC Managed Care – PPO | Source: Home / Self Care | Attending: Interventional Radiology | Admitting: Interventional Radiology

## 2019-10-26 ENCOUNTER — Ambulatory Visit (HOSPITAL_COMMUNITY)
Admission: RE | Admit: 2019-10-26 | Discharge: 2019-10-26 | Disposition: A | Discharge status: Home / Self Care | Payer: BC Managed Care – PPO | Source: Ambulatory Visit | Attending: Interventional Radiology | Admitting: Interventional Radiology

## 2019-10-26 ENCOUNTER — Encounter (HOSPITAL_COMMUNITY): Payer: Self-pay | Admitting: Interventional Radiology

## 2019-10-26 ENCOUNTER — Other Ambulatory Visit: Payer: Self-pay

## 2019-10-26 DIAGNOSIS — I671 Cerebral aneurysm, nonruptured: Secondary | ICD-10-CM | POA: Insufficient documentation

## 2019-10-26 DIAGNOSIS — F419 Anxiety disorder, unspecified: Secondary | ICD-10-CM | POA: Insufficient documentation

## 2019-10-26 DIAGNOSIS — Z79899 Other long term (current) drug therapy: Secondary | ICD-10-CM | POA: Diagnosis not present

## 2019-10-26 DIAGNOSIS — E785 Hyperlipidemia, unspecified: Secondary | ICD-10-CM | POA: Diagnosis not present

## 2019-10-26 DIAGNOSIS — I619 Nontraumatic intracerebral hemorrhage, unspecified: Secondary | ICD-10-CM | POA: Diagnosis not present

## 2019-10-26 DIAGNOSIS — G44209 Tension-type headache, unspecified, not intractable: Secondary | ICD-10-CM | POA: Diagnosis not present

## 2019-10-26 HISTORY — PX: RADIOLOGY WITH ANESTHESIA: SHX6223

## 2019-10-26 HISTORY — PX: IR ANGIO VERTEBRAL SEL VERTEBRAL BILAT MOD SED: IMG5369

## 2019-10-26 HISTORY — DX: Anxiety disorder, unspecified: F41.9

## 2019-10-26 LAB — URINALYSIS, COMPLETE (UACMP) WITH MICROSCOPIC
Bacteria, UA: NONE SEEN
Bilirubin Urine: NEGATIVE
Glucose, UA: NEGATIVE mg/dL
Ketones, ur: NEGATIVE mg/dL
Leukocytes,Ua: NEGATIVE
Nitrite: NEGATIVE
Protein, ur: NEGATIVE mg/dL
Specific Gravity, Urine: 1.016 (ref 1.005–1.030)
pH: 6 (ref 5.0–8.0)

## 2019-10-26 LAB — COMPREHENSIVE METABOLIC PANEL
ALT: 23 U/L (ref 0–44)
AST: 20 U/L (ref 15–41)
Albumin: 4.1 g/dL (ref 3.5–5.0)
Alkaline Phosphatase: 57 U/L (ref 38–126)
Anion gap: 10 (ref 5–15)
BUN: 13 mg/dL (ref 6–20)
CO2: 27 mmol/L (ref 22–32)
Calcium: 9.2 mg/dL (ref 8.9–10.3)
Chloride: 103 mmol/L (ref 98–111)
Creatinine, Ser: 0.69 mg/dL (ref 0.44–1.00)
GFR calc Af Amer: >60 mL/min (ref >60)
GFR calc non Af Amer: >60 mL/min (ref >60)
Glucose, Bld: 93 mg/dL (ref 70–99)
Potassium: 3.5 mmol/L (ref 3.5–5.1)
Sodium: 140 mmol/L (ref 135–145)
Total Bilirubin: 0.7 mg/dL (ref 0.3–1.2)
Total Protein: 8 g/dL (ref 6.5–8.1)

## 2019-10-26 LAB — HCG, QUANTITATIVE, PREGNANCY: hCG, Beta Chain, Quant, S: 1 m[IU]/mL (ref <5)

## 2019-10-26 LAB — POCT PREGNANCY, URINE: Preg Test, Ur: NEGATIVE

## 2019-10-26 LAB — APTT: aPTT: 30 seconds (ref 24–36)

## 2019-10-26 LAB — PROTIME-INR
INR: 1 (ref 0.8–1.2)
Prothrombin Time: 12.8 seconds (ref 11.4–15.2)

## 2019-10-26 SURGERY — IR WITH ANESTHESIA
Anesthesia: General

## 2019-10-26 MED ORDER — FENTANYL CITRATE (PF) 100 MCG/2ML IJ SOLN: 25.0000 ug | INTRAMUSCULAR | Status: DC | PRN

## 2019-10-26 MED ORDER — ONDANSETRON HCL 4 MG/2ML IJ SOLN: 4.0000 mg | Freq: Once | INTRAMUSCULAR | Status: DC | PRN

## 2019-10-26 MED ORDER — CLOPIDOGREL BISULFATE 75 MG PO TABS
75.0000 mg | ORAL_TABLET | ORAL | Status: AC
  Administered 2019-10-26: 75 mg via ORAL
  Filled 2019-10-26: qty 1

## 2019-10-26 MED ORDER — CHLORHEXIDINE GLUCONATE 0.12 % MT SOLN
15.0000 mL | Freq: Once | OROMUCOSAL | Status: AC
  Administered 2019-10-26: 15 mL via OROMUCOSAL
  Filled 2019-10-26: qty 15

## 2019-10-26 MED ORDER — HEPARIN SODIUM (PORCINE) 1000 UNIT/ML IJ SOLN
INTRAMUSCULAR | Status: DC | PRN
Start: 2019-10-26 — End: 2019-10-26
  Administered 2019-10-26: 1000 [IU] via INTRAVENOUS

## 2019-10-26 MED ORDER — ORAL CARE MOUTH RINSE: 15.0000 mL | Freq: Once | OROMUCOSAL | Status: AC

## 2019-10-26 MED ORDER — FENTANYL CITRATE (PF) 100 MCG/2ML IJ SOLN
INTRAMUSCULAR | Status: DC | PRN
  Administered 2019-10-26 (×2): 50 ug via INTRAVENOUS

## 2019-10-26 MED ORDER — ASPIRIN EC 325 MG PO TBEC
325.0000 mg | DELAYED_RELEASE_TABLET | ORAL | Status: AC
  Administered 2019-10-26: 325 mg via ORAL
  Filled 2019-10-26: qty 1

## 2019-10-26 MED ORDER — CEFAZOLIN SODIUM-DEXTROSE 2-4 GM/100ML-% IV SOLN
2.0000 g | INTRAVENOUS | Status: AC
  Administered 2019-10-26: 2 g via INTRAVENOUS
  Filled 2019-10-26: qty 100

## 2019-10-26 MED ORDER — LACTATED RINGERS IV SOLN: INTRAVENOUS | Status: DC

## 2019-10-26 MED ORDER — SODIUM CHLORIDE 0.9 % IV SOLN: INTRAVENOUS | Status: AC

## 2019-10-26 MED ORDER — MIDAZOLAM HCL 5 MG/5ML IJ SOLN
INTRAMUSCULAR | Status: DC | PRN
  Administered 2019-10-26: .5 mg via INTRAVENOUS
  Administered 2019-10-26: 1 mg via INTRAVENOUS
  Administered 2019-10-26: .5 mg via INTRAVENOUS

## 2019-10-26 MED ORDER — NIMODIPINE 30 MG PO CAPS: 0.0000 mg | ORAL_CAPSULE | ORAL | Status: DC

## 2019-10-26 MED ORDER — SODIUM CHLORIDE 0.9 % IV SOLN: INTRAVENOUS | Status: DC

## 2019-10-26 MED ORDER — IOHEXOL 300 MG/ML  SOLN: 150.0000 mL | Freq: Once | INTRAMUSCULAR | Status: DC | PRN

## 2019-10-26 MED ORDER — LIDOCAINE HCL 1 % IJ SOLN
INTRAMUSCULAR | Status: AC
  Filled 2019-10-26: qty 20

## 2019-10-26 MED ORDER — LACTATED RINGERS IV SOLN
INTRAVENOUS | Status: DC | PRN
Start: 2019-10-26 — End: 2019-10-26

## 2019-10-26 NOTE — Anesthesia Preprocedure Evaluation (Addendum)
Anesthesia Evaluation  Patient identified by MRN, date of birth, ID band Patient awake    Reviewed: Allergy & Precautions, NPO status , Patient's Chart, lab work & pertinent test results  Airway Mallampati: II  TM Distance: >3 FB Neck ROM: Full    Dental  (+) Teeth Intact, Dental Advisory Given   Pulmonary    breath sounds clear to auscultation       Cardiovascular  Rhythm:Regular     Neuro/Psych    GI/Hepatic   Endo/Other    Renal/GU      Musculoskeletal   Abdominal   Peds  Hematology   Anesthesia Other Findings   Reproductive/Obstetrics                            Anesthesia Physical Anesthesia Plan  ASA: II  Anesthesia Plan: General   Post-op Pain Management:    Induction: Intravenous  PONV Risk Score and Plan: Ondansetron and Dexamethasone  Airway Management Planned: Oral ETT  Additional Equipment: Arterial line  Intra-op Plan:   Post-operative Plan: Extubation in OR  Informed Consent: I have reviewed the patients History and Physical, chart, labs and discussed the procedure including the risks, benefits and alternatives for the proposed anesthesia with the patient or authorized representative who has indicated his/her understanding and acceptance.     Dental advisory given  Plan Discussed with: CRNA and Anesthesiologist  Anesthesia Plan Comments:         Anesthesia Quick Evaluation

## 2019-10-26 NOTE — Transfer of Care (Signed)
Immediate Anesthesia Transfer of Care Note  Patient: [name] [name]  Procedure(s) Performed: EMBOLIZATION (N/A )  Patient Location: PACU  Anesthesia Type:MAC  Level of Consciousness: awake and patient cooperative  Airway & Oxygen Therapy: Patient Spontanous Breathing  Post-op Assessment: Report given to RN, Post -op Vital signs reviewed and stable and Patient moving all extremities  Post vital signs: Reviewed and stable  Last Vitals:  Vitals Value Taken Time  BP 109/51 10/26/19 1116  Temp    Pulse 77 10/26/19 1116  Resp 11 10/26/19 1116  SpO2 97 % 10/26/19 1116  Vitals shown include unvalidated device data.  Last Pain:  Vitals:   10/26/19 0646  TempSrc: Oral  PainSc: 0-No pain         Complications: No complications documented.

## 2019-10-26 NOTE — H&P (Signed)
Chief Complaint: DAVF  Referring Physician(s): Rosalin Hawking  Supervising Physician: Luanne Bras  Patient Status: [facility] - Out-pt  History of Present Illness: [name] [name] is a 44 y.o. 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   She is known to [facility] and has been followed by Dr. Estanislado Pandy since 01/2019. She first presented to our department at the request of Dr. Erlinda Hong during admission for [facility] 02/21/2019 to 02/24/2019.   During that admission, she underwent an image-guided diagnostic cerebral arteriogram 02/23/2019 by Dr. Estanislado Pandy which revealed no evidence of AV shunt, AVM, aneurysm, or dissection.   She then underwent a follow-up image-guided diagnostic cerebral arteriogram 03/31/2019 by Dr. 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   She then consulted with Dr. Estanislado Pandy 04/16/2019 to discuss management options. At that time, it was recommended that patient pursue conservative management for her DAVF, including routine imaging scans to monitor for changes, with plans for possible intervention following this.  MRI/MRA brain/head 09/03/2019: 1. 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 Volume loss, gliosis and hemosiderin deposition in that region. No unexpected or worsening finding. The remainder the brain is normal. 2. Intracranial MR angiography does not show any high flow vascular abnormality in the region of the right occipital hemorrhage. There are some residual blood products in the area with some T1 hyperintensity, but this does not represent high flow arterial signal.  She is here today to undergo diagnostic cerebral arteriogram with possible  embolization.   She c/o mild tinnitus in the right ear today. No nausea/vomiting. No Fever/chills. ROS negative.  She is NPO.  Past Medical History:  Diagnosis Date  . Anxiety   . ICH (intracerebral hemorrhage) ([place]) 02/21/2019    Past Surgical History:  Procedure Laterality Date  . CESAREAN SECTION     x 2  . IR ANGIO EXTERNAL CAROTID SEL EXT CAROTID UNI R MOD SED  03/31/2019  . IR ANGIO INTRA EXTRACRAN SEL COM CAROTID INNOMINATE BILAT MOD SED  02/23/2019  . IR ANGIO INTRA EXTRACRAN SEL COM CAROTID INNOMINATE BILAT MOD SED  03/31/2019  . IR ANGIO VERTEBRAL SEL VERTEBRAL BILAT MOD SED  02/23/2019  . IR ANGIO VERTEBRAL SEL VERTEBRAL BILAT MOD SED  03/31/2019  . MANDIBLE FRACTURE SURGERY  1990    Allergies: Other  Medications: Prior to Admission medications   Medication Sig Start Date End Date Taking? Authorizing Provider  acetaminophen (TYLENOL) 500 MG tablet Take 1,000 mg by mouth every 8 (eight) hours as needed for moderate pain.    [provider]  atorvastatin (LIPITOR) 10 MG tablet Take 1 tablet (10 mg total) by mouth daily. 02/24/19 02/24/20  Donzetta Starch, NP  calcium carbonate (TUMS - DOSED IN MG ELEMENTAL CALCIUM) 500 MG chewable tablet Chew 1-2 tablets by mouth daily as needed for indigestion or heartburn.    [provider]  hydrOXYzine (ATARAX/VISTARIL) 25 MG tablet Take 25 mg by mouth at bedtime.  03/03/19   [provider]  Melatonin 10 MG CAPS Take 10 mg by mouth at bedtime.    [provider]  Multiple Vitamin (MULTIVITAMIN WITH MINERALS) TABS tablet Take 1 tablet by mouth daily.    [provider]  sertraline (ZOLOFT) 100 MG tablet Take 150 mg  by mouth daily.  11/26/18   [provider]     Family History  Problem Relation Age of Onset  . Hypertension Mother   . Hypertension Father   . Diabetes Father     Social History   Socioeconomic History  . Marital status: Married    Spouse name: Not on file  .  Number of children: Not on file  . Years of education: Not on file  . Highest education level: Not on file  Occupational History  . Not on file  Tobacco Use  . Smoking status: Never Smoker  . Smokeless tobacco: Never Used  Vaping Use  . Vaping Use: Never used  Substance and Sexual Activity  . Alcohol use: Not Currently  . Drug use: Never  . Sexual activity: Not on file  Other Topics Concern  . Not on file  Social History Narrative  . Not on file   Social Determinants of Health   Financial Resource Strain:   . Difficulty of Paying Living Expenses:   Food Insecurity:   . Worried About Charity fundraiser in the Last Year:   . Arboriculturist in the Last Year:   Transportation Needs: No Transportation Needs  . Lack of Transportation (Medical): No  . Lack of Transportation (Non-Medical): No  Physical Activity:   . Days of Exercise per Week:   . Minutes of Exercise per Session:   Stress:   . Feeling of Stress :   Social Connections:   . Frequency of Communication with Friends and Family:   . Frequency of Social Gatherings with Friends and Family:   . Attends Religious Services:   . Active Member of Clubs or Organizations:   . Attends Archivist Meetings:   Marland Kitchen Marital Status:      Review of Systems: A 12 point ROS discussed and pertinent positives are indicated in the HPI above.  All other systems are negative.  Review of Systems  Vital Signs: LMP 10/22/2019   Physical Exam Vitals reviewed.  Constitutional:      Appearance: Normal appearance.  HENT:     Head: Normocephalic and atraumatic.  Eyes:     Extraocular Movements: Extraocular movements intact.     Pupils: Pupils are equal, round, and reactive to light.     Comments: Visual fields intact  Cardiovascular:     Rate and Rhythm: Normal rate and regular rhythm.  Pulmonary:     Effort: Pulmonary effort is normal. No respiratory distress.     Breath sounds: Normal breath sounds.  Abdominal:      General: There is no distension.     Palpations: Abdomen is soft.     Tenderness: There is no abdominal tenderness.  Musculoskeletal:        General: Normal range of motion.  Skin:    General: Skin is warm and dry.  Neurological:     General: No focal deficit present.     Mental Status: She is alert and oriented to person, place, and time.  Psychiatric:        Mood and Affect: Mood normal.        Behavior: Behavior normal.        Thought Content: Thought content normal.        Judgment: Judgment normal.     Imaging: No results found.  Labs:  CBC: Recent Labs    02/21/19 1003 02/22/19 0400 02/23/19 0345 03/31/19 0700  WBC 7.1 10.9* 8.0 7.4  HGB  14.3 14.1 14.1 13.4  HCT 43.8 41.9 41.9 40.6  PLT 248 266 245 244    COAGS: Recent Labs    02/21/19 1003 03/31/19 0700 10/26/19 0655  INR 1.0 1.0 1.0  APTT 27  --  30    BMP: Recent Labs    02/21/19 1003 02/22/19 0400 02/23/19 0345 03/31/19 0700  NA 137 138 137 139  K 3.5 3.8 3.9 3.6  CL 105 105 102 104  CO2 24 23 26 28   GLUCOSE 108* 109* 96 91  BUN 13 11 12 11   CALCIUM 8.9 8.8* 8.8* 9.1  CREATININE 0.72 0.72 0.82 0.68  GFRNONAA >60 >60 >60 >60  GFRAA >60 >60 >60 >60    LIVER FUNCTION TESTS: Recent Labs    02/21/19 1003  BILITOT 0.5  AST 20  ALT 21  ALKPHOS 49  PROT 7.5  ALBUMIN 3.9    TUMOR MARKERS: No results for input(s): AFPTM, CEA, CA199, CHROMGRNA in the last 8760 hours.  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  Will proceed with diagnostic cerebral arteriogram with possible embolization today by Dr. Bronson Curb.  Risks and benefits of cerebral angiogram with intervention were discussed with the patient including, but not limited to bleeding, infection, vascular injury,  contrast induced renal failure, stroke or even death.  This interventional procedure involves the use of X-rays and because of the nature of the planned procedure, it is possible that we will have prolonged use of X-ray fluoroscopy.  Potential radiation risks to you include (but are not limited to) the following: - A slightly elevated risk for cancer  several years later in life. This risk is typically less than 0.5% percent. This risk is low in comparison to the normal incidence of human cancer, which is 33% for women and 50% for men according to the [employer]. - Radiation induced injury can include skin redness, resembling a rash, tissue breakdown / ulcers and hair loss (which can be temporary or permanent).   The likelihood of either of these occurring depends on the difficulty of the procedure and whether you are sensitive to radiation due to previous procedures, disease, or genetic conditions.   IF your procedure requires a prolonged use of radiation, you will be notified and given written instructions for further action.  It is your responsibility to monitor the irradiated area for the 2 weeks following the procedure and to notify your physician if you are concerned that you have suffered a radiation induced injury.    All of the patient's questions were answered, patient is agreeable to proceed.  Consent signed and in chart.  Electronically Signed: Murrell Redden, PA-C   10/26/2019, 8:18 AM      I spent a total of    25 Minutes in face to face in clinical consultation, greater than 50% of which was counseling/coordinating care for cerebral angio/intervention.

## 2019-10-26 NOTE — Discharge Instructions (Signed)
Femoral Site Care This sheet gives you information about how to care for yourself after your procedure. Your health care provider may also give you more specific instructions. If you have problems or questions, contact your health care provider. What can I expect after the procedure?  After the procedure, it is common to have:  Bruising that usually fades within 1-2 weeks.  Tenderness at the site. Follow these instructions at home: Wound care 1. Follow instructions from your health care provider about how to take care of your insertion site. Make sure you: ? Wash your hands with soap and water before you change your bandage (dressing). If soap and water are not available, use hand sanitizer. ? Remove your dressing as told by your health care provider. 2. Do not take baths, swim, or use a hot tub until your health care provider approves. 3. You may shower 24-48 hours after the procedure or as told by your health care provider. ? Gently wash the site with plain soap and water. ? Pat the area dry with a clean towel. ? Do not rub the site. This may cause bleeding. 4. Do not apply powder or lotion to the site. Keep the site clean and dry. 5. Check your femoral site every day for signs of infection. Check for: ? Redness, swelling, or pain. ? Fluid or blood. ? Warmth. ? Pus or a bad smell. Activity 1. For the first 2-3 days after your procedure, or as long as directed: ? Avoid climbing stairs as much as possible. ? Do not squat. 2. Do not lift anything that is heavier than 10 lb (4.5 kg), or the limit that you are told, until your health care provider says that it is safe. For 5 days 3. Rest as directed. ? Avoid sitting for a long time without moving. Get up to take short walks every 1-2 hours. 4. Do not drive for 24 hours if you were given a medicine to help you relax (sedative). General instructions  Take over-the-counter and prescription medicines only as told by your health care  provider.  Keep all follow-up visits as told by your health care provider. This is important. Contact a health care provider if you have:  A fever or chills.  You have redness, swelling, or pain around your insertion site. Get help right away if:  The catheter insertion area swells very fast.  You pass out.  You suddenly start to sweat or your skin gets clammy.  The catheter insertion area is bleeding, and the bleeding does not stop when you hold steady pressure on the area.  The area near or just beyond the catheter insertion site becomes pale, cool, tingly, or numb. These symptoms may represent a serious problem that is an emergency. Do not wait to see if the symptoms will go away. Get medical help right away. Call your local emergency services (911 in the [place] Do not drive yourself to the hospital. Summary  After the procedure, it is common to have bruising that usually fades within 1-2 weeks.  Check your femoral site every day for signs of infection.  Do not lift anything that is heavier than 10 lb (4.5 kg), or the limit that you are told, until your health care provider says that it is safe. This information is not intended to replace advice given to you by your health care provider. Make sure you discuss any questions you have with your health care provider. Document Revised: 03/25/2017 Document Reviewed: 03/25/2017 Elsevier Patient Education  [place].

## 2019-10-26 NOTE — Anesthesia Postprocedure Evaluation (Signed)
Anesthesia Post Note  Patient: [name] [name]  Procedure(s) Performed: EMBOLIZATION (N/A )     Patient location during evaluation: PACU Anesthesia Type: General Level of consciousness: awake and alert Pain management: pain level controlled Vital Signs Assessment: post-procedure vital signs reviewed and stable Respiratory status: spontaneous breathing, nonlabored ventilation, respiratory function stable and patient connected to nasal cannula oxygen Cardiovascular status: stable and blood pressure returned to baseline Postop Assessment: no apparent nausea or vomiting Anesthetic complications: no   No complications documented.  Last Vitals:  Vitals:   10/26/19 1116 10/26/19 1130  BP: (!) 109/51 (!) 109/59  Pulse: 72 73  Resp: 17 18  Temp: 36.9 C   SpO2: 96% 96%    Last Pain:  Vitals:   10/26/19 1116  TempSrc:   PainSc: 0-No pain                 Zahra Peffley COKER

## 2019-10-26 NOTE — Progress Notes (Signed)
Lab called and stated there was a leak in p2y12 sample and new sample needed to be sent down. Caryl Pina called and she is going to draw new sample.

## 2019-10-26 NOTE — Sedation Documentation (Signed)
Site closed with 5 fr exoseal, pressure released at 1102

## 2019-10-26 NOTE — Procedures (Signed)
S/P bilateral vertebral arteriogra  RT CFA approach. Findings. 1.Fast flow DAVF supplied by distal branch of the RT parietal occipital  Branch of RT PCA draining via single cortical vein into the SSS.  S.Cuthbert Turton MD

## 2019-10-27 ENCOUNTER — Encounter (HOSPITAL_COMMUNITY): Payer: Self-pay | Admitting: Interventional Radiology

## 2019-11-03 ENCOUNTER — Encounter: Payer: Self-pay | Admitting: Adult Health

## 2019-11-03 ENCOUNTER — Ambulatory Visit: Payer: BC Managed Care – PPO | Admitting: Adult Health

## 2019-11-03 ENCOUNTER — Other Ambulatory Visit: Payer: Self-pay

## 2019-11-03 VITALS — BP 118/80 | HR 82 | Ht 67.0 in | Wt 182.2 lb

## 2019-11-03 DIAGNOSIS — E785 Hyperlipidemia, unspecified: Secondary | ICD-10-CM | POA: Diagnosis not present

## 2019-11-03 DIAGNOSIS — I611 Nontraumatic intracerebral hemorrhage in hemisphere, cortical: Secondary | ICD-10-CM

## 2019-11-03 DIAGNOSIS — I671 Cerebral aneurysm, nonruptured: Secondary | ICD-10-CM | POA: Diagnosis not present

## 2019-11-03 NOTE — Progress Notes (Signed)
I agree with the above plan 

## 2019-11-03 NOTE — Patient Instructions (Addendum)
Your Plan:  Please follow up with Dr. Estanislado Pandy regarding further questions - you should be called by their office for follow up  No changes today     Follow up on an as needed basis as you have been stable from our standpoint       Thank you for coming to see [place] at [facility]. I hope we have been able to provide you high quality care today.  You may receive a patient satisfaction survey over the next few weeks. We would appreciate your feedback and comments so that we may continue to improve ourselves and the health of our patients.

## 2019-11-03 NOTE — Progress Notes (Signed)
[facility] [address] [place]. [place] [address] [phone]       STROKE FOLLOW UP NOTE  [name] Date of Birth:  [birth year] Medical Record Number:  [id number]   Reason for Referral:  stroke follow up    CHIEF COMPLAINT:  Chief Complaint  Patient presents with  . Follow-up    6 month f/u for stroke. States she has been doing ok since last visit.   Marland Kitchen room 9    alone     HPI: Stroke admission 02/21/2019: MorganMorgan [name] Sylvesteris a 44 y.o.femalewith history of migraine headaches and depression presented on 02/21/2019 to [facility] with C/o of vision loss and HA. Stroke work-up revealed small acute right occipital ICH on CT head and MRI brain secondary to unknown cause. Shedid not receive IV t-PA due to [place]. MRI brain w/wo did not show any underlying abnormalities such as mass, infarct or venous thrombosis.  MRV negative for CVT.  CTA head/neck unremarkable without evidence of AVM, aneurysm or vasospasm.  2D echo showed an EF of 60 to 65%.  Cerebral angiogram showed possible attenuated caliber of the right mid transverse sinus but no findings associated with current hemorrhage.  SARS coronavirus 2 negative.  No prior antithrombotic use and did not recommend initiating due to hemorrhage.  No prior history or evidence of HTN.  LDL 112 and recommend initiating low-dose atorvastatin at discharge.  No evidence or history of DM with A1c 4.8.  History of migraines/tension headache with use of Excedrin as needed PTA.  Other stroke risk factors include OCP use and advise discontinuing.  Discharged home in stable condition with recommendation of outpatient OT and recommended repeating cerebral angiogram in 6 weeks.  Initial visit 03/16/2019: [name] is a 44 year old female who is being seen today for hospital follow-up.  Residual deficits of difficulty reading such as difficulty focusing on letters or numbers and mild frontal headaches.  She does endorse  improvement of vision and attempted to participate in outpatient OT per patient, therapy was on hold due to "shortstaffed" at neuro rehab.  She has not received a phone call since that time to schedule visit.  She also experienced blurriness left eye upper vision while watching TV that lasted only short duration accompanied by mild tension headache throughout the day.  She has not experienced any recurrent blurriness sensation.  She does have initial evaluation with ophthalmology on 04/06/2018 with hopes of returning to driving after that appointment.  Underlying history of migraines which she has not experienced recently but does have frequent tension type headaches along the frontal portion of her head but patient believes this is due to increased stress and anxiety.  Underlying history of anxiety with recent dose increase of sertraline from 100 mg daily to 150 mg daily along with initiating hydroxyzine 25 mg nightly as needed.  She does endorse improvement.  Husband did call into office last week regarding patient complaining of bilateral "ear cracking sound" along with right ear pulsating.  She does endorse history of "jaw cracking" and pain over the past 3 to 6 months months also accompanied by "ear cracking sound".  Husband endorses grinding of teeth at night.  Right ear pulsating sensation has been present since her stroke.  Typically only present when she lays down or when she is using her cell phone on the left side.  She denies any decreased hearing.  She does have history of reconstructive therapy on her jaw at age 63. she is scheduled  to repeat cerebral angiogram on 03/31/2019.  She has continued on atorvastatin without myalgias.  Blood pressure satisfactory at 115/78.  She does express concerns regarding unknown etiology of ICH and fear of reoccurring ICH.  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  No further concerns at this time.  Update  2/10/20201: [name] [name] is a 44 year old female who is being seen today for follow up.  She continues to experience whooshing sensation and clicking sensation in her right ear and over the past week, has been experiencing intermittent short episodes of blurred vision.  Follow-up with Dr. 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  MRI w/wo contrast obtained on 04/11/2019 which did not show underlying abnormalities with expected evolutionary changes of right occipital hematoma and resolution of surrounding edema.  Plans on repeating MRI/MRV brain 3 to 4 months with plans for intervention if remains stable.  She was evaluated by ophthalmology with finding of left eye muscle weakness and may proceed with intervention when she is stable from a neurological/vascular standpoint.  Continues on atorvastatin without myalgias.  Blood pressure today stable at 113/75.  No further concerns at this time.  Update 11/03/2019 JM: [name] [name] returns for follow-up regarding [place] secondary to AVM. Accompanied by her husband via cell phone  Continues to experience occasional blurred vision and occasional pulsatile tinnitus but overall improving  Recently underwent cerebral angio for AVM with possible embolization on 10/26/2019 by Dr. Estanislado Pandy. Per patient, potential risk of endovascular obliteration therefore referred to radiation department for possible radiosurgical obliteration.  She has not been contacted for further evaluation.  She has multiple questions today regarding risk of treatment for AVM vs risk of hemorrhage if left untreated, possible need of second opinion, reasoning why embolization posed increased risk during angiogram and this was not previously discussed and requesting further information regarding AVM and treatments  for further decision-making  Denies new or worsening stroke/TIA symptoms.  No further concerns.      ROS:   14 system review of systems performed and negative with exception of intermittent blurred vision and tinnitus  PMH:  Past Medical History:  Diagnosis Date  . Anxiety   . ICH (intracerebral hemorrhage) ([place]) 02/21/2019    PSH:  Past Surgical History:  Procedure Laterality Date  . CESAREAN SECTION     x 2  . IR ANGIO EXTERNAL CAROTID SEL EXT CAROTID UNI R MOD SED  03/31/2019  . IR ANGIO INTRA EXTRACRAN SEL COM CAROTID INNOMINATE BILAT MOD SED  02/23/2019  . IR ANGIO INTRA EXTRACRAN SEL COM CAROTID INNOMINATE BILAT MOD SED  03/31/2019  . IR ANGIO VERTEBRAL SEL VERTEBRAL BILAT MOD SED  02/23/2019  . IR ANGIO VERTEBRAL SEL VERTEBRAL BILAT MOD SED  03/31/2019  . IR ANGIO VERTEBRAL SEL VERTEBRAL BILAT MOD SED  10/26/2019  . [place]  . RADIOLOGY WITH ANESTHESIA N/A 10/26/2019   Procedure: EMBOLIZATION;  Surgeon: Luanne Bras, MD;  Location: [facility];  Service: Radiology;  Laterality: N/A;    Social History:  Social History   Socioeconomic History  . Marital status: Married    Spouse name: Not on file  . Number of children: Not on file  . Years of education: Not on file  . Highest education level: Not on file  Occupational History  . Not on file  Tobacco Use  . Smoking status: Never Smoker  . Smokeless tobacco: Never Used  Vaping Use  . Vaping Use: Never used  Substance and Sexual Activity  . Alcohol use: Not Currently  . Drug use: Never  . Sexual activity: Not on file  Other Topics Concern  . Not on file  Social History Narrative  . Not on file   Social Determinants of Health   Financial Resource Strain:   . Difficulty of Paying Living Expenses:   Food Insecurity:   . Worried About Charity fundraiser in the Last Year:   . Arboriculturist in the Last Year:   Transportation Needs: No Transportation Needs  . Lack of Transportation  (Medical): No  . Lack of Transportation (Non-Medical): No  Physical Activity:   . Days of Exercise per Week:   . Minutes of Exercise per Session:   Stress:   . Feeling of Stress :   Social Connections:   . Frequency of Communication with Friends and Family:   . Frequency of Social Gatherings with Friends and Family:   . Attends Religious Services:   . Active Member of Clubs or Organizations:   . Attends Archivist Meetings:   Marland Kitchen Marital Status:   Intimate Partner Violence:   . Fear of Current or Ex-Partner:   . Emotionally Abused:   Marland Kitchen Physically Abused:   . Sexually Abused:     Family History:  Family History  Problem Relation Age of Onset  . Hypertension Mother   . Hypertension Father   . Diabetes Father     Medications:   Current Outpatient Medications on File Prior to Visit  Medication Sig Dispense Refill  . acetaminophen (TYLENOL) 500 MG tablet Take 1,000 mg by mouth every 8 (eight) hours as needed for moderate pain.    Marland Kitchen atorvastatin (LIPITOR) 10 MG tablet Take 1 tablet (10 mg total) by mouth daily. 30 tablet 2  . calcium carbonate (TUMS - DOSED IN MG ELEMENTAL CALCIUM) 500 MG chewable tablet Chew 1-2 tablets by mouth daily as needed for indigestion or heartburn.    . hydrOXYzine (ATARAX/VISTARIL) 25 MG tablet Take 25 mg by mouth at bedtime.     . Melatonin 10 MG CAPS Take 10 mg by mouth at bedtime.    . Multiple Vitamin (MULTIVITAMIN WITH MINERALS) TABS tablet Take 1 tablet by mouth daily.    . sertraline (ZOLOFT) 100 MG tablet Take 150 mg by mouth daily.      No current facility-administered medications on file prior to visit.    Allergies:   Allergies  Allergen Reactions  . Other Nausea And Vomiting    "berries"     Physical Exam  Vitals:   11/03/19 1245  BP: 118/80  Pulse: 82  Weight: 182 lb 3.2 oz (82.6 kg)  Height: 5\' 7"  (1.702 m)   Body mass index is 28.54 kg/m. No exam data present  General: well developed, well nourished,   pleasantly anxious middle-age Caucasian female, seated, in no evident distress Head: head normocephalic and atraumatic.   Neck: supple with no carotid or supraclavicular bruits Cardiovascular: regular rate and rhythm, no murmurs Musculoskeletal: no deformity Skin:  no rash/petichiae Vascular:  Normal pulses all extremities   Neurologic Exam Mental Status: Awake and fully alert.   Fluent speech and language.  Oriented to place and time. Recent and remote memory intact. Attention span, concentration and fund of knowledge appropriate. Mood  and affect appropriate.  Cranial Nerves: Pupils equal, briskly reactive to light. Extraocular movements full without nystagmus.  Evidence of left eye convergence insufficiency.  Visual fields full to confrontation. Hearing intact. Facial sensation intact. Face, tongue, palate moves normally and symmetrically.  Motor: Normal bulk and tone. Normal strength in all tested extremity muscles. Sensory.: intact to touch , pinprick , position and vibratory sensation.  Coordination: Rapid alternating movements normal in all extremities. Finger-to-nose and heel-to-shin performed accurately bilaterally. Gait and Station: Arises from chair without difficulty. Stance is normal. Gait demonstrates normal stride length and balance Reflexes: 1+ and symmetric. Toes downgoing.       ASSESSMENT/PLAN: [name] [name] is a 44 y.o. 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 Vascular risk factors include HLD and migraine headaches.     1. Right occipital ICH:  a. Advised to avoid aspirin, aspirin-containing products or ibuprofen products due to hemorrhage unless instructed otherwise.     b. Continue atorvastatin for secondary stroke prevention.  c. Ensure close PCP follow-up for aggressive stroke risk factor management 2. Dural AV fistula:  a. Intermittent blurred vision and tinnitus with ongoing improvement.   b. Cerebral angiogram with possible embolization 10/26/2019 -too risky to treat by endovascular obliteration therefore referred to radiation for possible radiosurgical treatment c. Patient had multiple questions today and answered to the best my ability but also advised to speak further to Dr. Estanislado Pandy to answer further questions. 3. HLD: a. LDL goal<70 b. Continue atorvastatin c. Prescribed and managed by PCP   Overall stable from a stroke standpoint and recommend follow-up on an as-needed basis   I spent 30 minutes of face-to-face and non-face-to-face time with patient and husband (via video conference).  This included previsit chart review, lab review, study review, order entry, electronic health record documentation, patient education regarding right occipital ICH secondary to DAVF answering multiple questions as able as well as discussed secondary stroke risk factor management and answered all of her questions to patient and husband satisfaction    Frann Rider, AGNP-BC  [facility] [address] [place], [place] [address]  Phone [phone] Fax [phone] Note: This document was prepared with digital dictation and possible smart phrase technology. Any transcriptional errors that result from this process are unintentional.

## 2019-11-05 ENCOUNTER — Other Ambulatory Visit: Payer: Self-pay | Admitting: Radiation Therapy

## 2019-11-05 NOTE — Progress Notes (Signed)
Perfect.  Thank you so much.  She had multiple questions and was overly anxious and unfortunately I was only able to provide her with limited information and answers.  Greatly appreciate your follow-up.

## 2019-11-09 ENCOUNTER — Encounter: Payer: Self-pay | Admitting: Radiation Oncology

## 2019-11-10 ENCOUNTER — Other Ambulatory Visit (HOSPITAL_COMMUNITY): Payer: Self-pay | Admitting: Interventional Radiology

## 2019-11-10 DIAGNOSIS — I671 Cerebral aneurysm, nonruptured: Secondary | ICD-10-CM

## 2019-11-12 ENCOUNTER — Ambulatory Visit (HOSPITAL_COMMUNITY)
Admission: RE | Admit: 2019-11-12 | Discharge: 2019-11-12 | Disposition: A | Discharge status: Home / Self Care | Payer: BC Managed Care – PPO | Source: Ambulatory Visit | Attending: Interventional Radiology | Admitting: Interventional Radiology

## 2019-11-12 ENCOUNTER — Other Ambulatory Visit: Payer: Self-pay

## 2019-11-12 DIAGNOSIS — I671 Cerebral aneurysm, nonruptured: Secondary | ICD-10-CM

## 2019-11-20 ENCOUNTER — Telehealth (HOSPITAL_COMMUNITY): Payer: Self-pay | Admitting: Radiology

## 2019-11-20 NOTE — Telephone Encounter (Signed)
Called pt to let her know that we heard back from Dr. Evangeline Gula at [facility]. She has an appt with them on 12/23/19. JM

## 2019-12-23 DIAGNOSIS — I608 Other nontraumatic subarachnoid hemorrhage: Secondary | ICD-10-CM | POA: Diagnosis not present

## 2020-01-25 DIAGNOSIS — Z0000 Encounter for general adult medical examination without abnormal findings: Secondary | ICD-10-CM | POA: Diagnosis not present

## 2020-01-25 DIAGNOSIS — F5101 Primary insomnia: Secondary | ICD-10-CM | POA: Diagnosis not present

## 2020-01-25 DIAGNOSIS — E782 Mixed hyperlipidemia: Secondary | ICD-10-CM | POA: Diagnosis not present

## 2020-01-25 DIAGNOSIS — F411 Generalized anxiety disorder: Secondary | ICD-10-CM | POA: Diagnosis not present

## 2020-01-28 DIAGNOSIS — I878 Other specified disorders of veins: Secondary | ICD-10-CM | POA: Diagnosis not present

## 2020-01-28 DIAGNOSIS — I619 Nontraumatic intracerebral hemorrhage, unspecified: Secondary | ICD-10-CM | POA: Diagnosis not present

## 2020-01-28 DIAGNOSIS — Q282 Arteriovenous malformation of cerebral vessels: Secondary | ICD-10-CM | POA: Diagnosis not present

## 2020-01-28 DIAGNOSIS — Z01818 Encounter for other preprocedural examination: Secondary | ICD-10-CM | POA: Diagnosis not present

## 2020-02-03 DIAGNOSIS — Q282 Arteriovenous malformation of cerebral vessels: Secondary | ICD-10-CM | POA: Diagnosis not present

## 2020-02-04 DIAGNOSIS — G9389 Other specified disorders of brain: Secondary | ICD-10-CM | POA: Diagnosis not present

## 2020-02-04 DIAGNOSIS — Q282 Arteriovenous malformation of cerebral vessels: Secondary | ICD-10-CM | POA: Diagnosis not present

## 2020-02-04 HISTORY — PX: APPLICATION OF CRANIAL NAVIGATION: SHX6578

## 2020-04-09 IMAGING — XA IR ANGIO INTRA EXTRACRAN SEL COM CAROTID INNOMINATE BILAT MOD SE
1 series · 11 of 24 positions shown · IV contrast (IODINE)
Comparison: MRI MRA of the brain February 21, 2019, and
diagnostic arteriogram February 23, 2019.

CLINICAL DATA: Right sided parieto-occipital hemorrhage
approximately 7 weeks ago. Continues to have headaches with new
symptoms of right-sided pulsatile tinnitus.

EXAM:
BILATERAL COMMON CAROTID AND INNOMINATE ANGIOGRAPHY
TECHNIQUE: Informed written consent was obtained from the patient after a
thorough discussion of the procedural risks, benefits and
alternatives. All questions were addressed. Maximal Sterile Barrier
Technique was utilized including caps, mask, sterile gowns, sterile
gloves, sterile drape, hand hygiene and skin antiseptic. A timeout
was performed prior to the initiation of the procedure.

[Series 300: dr. (person_name) · 11 of 253 slices shown]
[im 11/253]
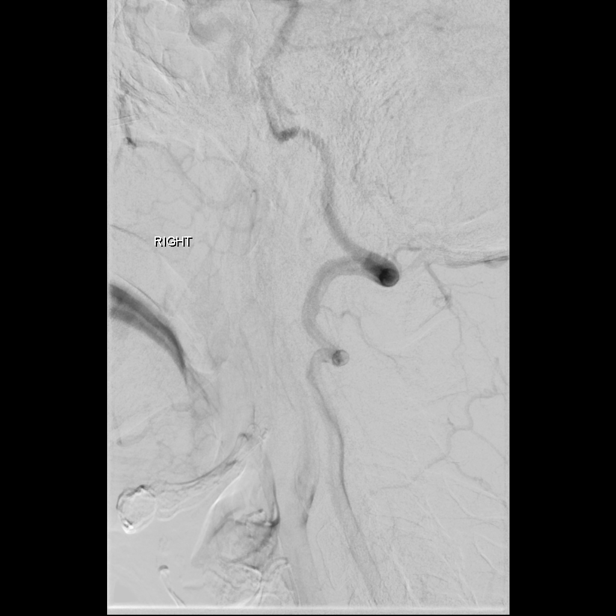
[im 33/253]
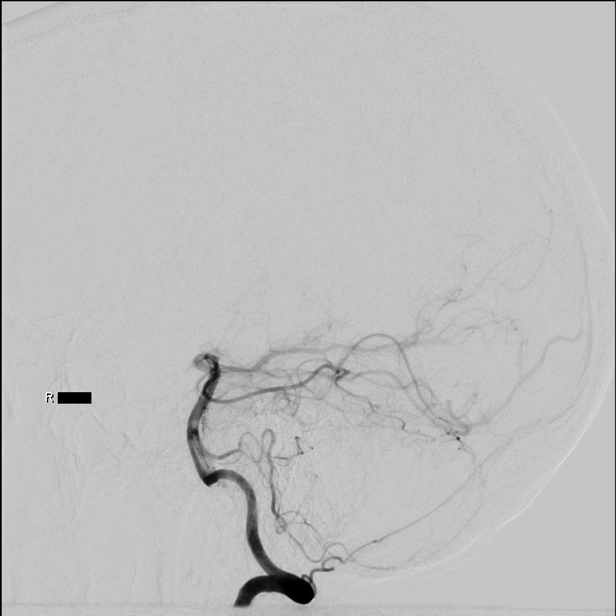
[im 55/253]
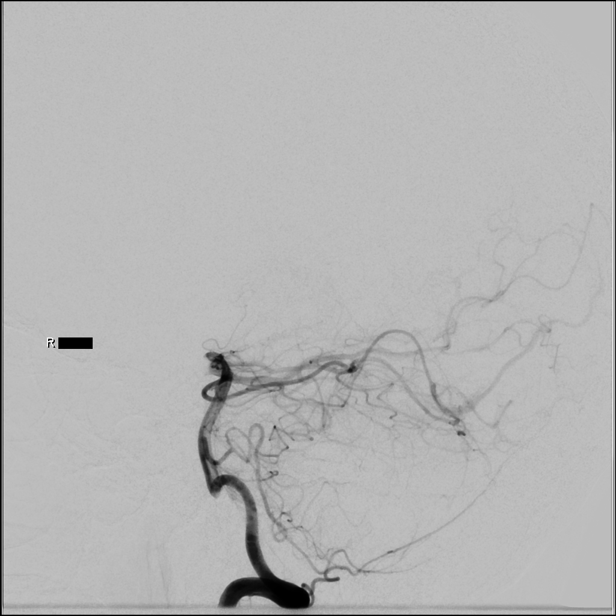
[im 77/253]
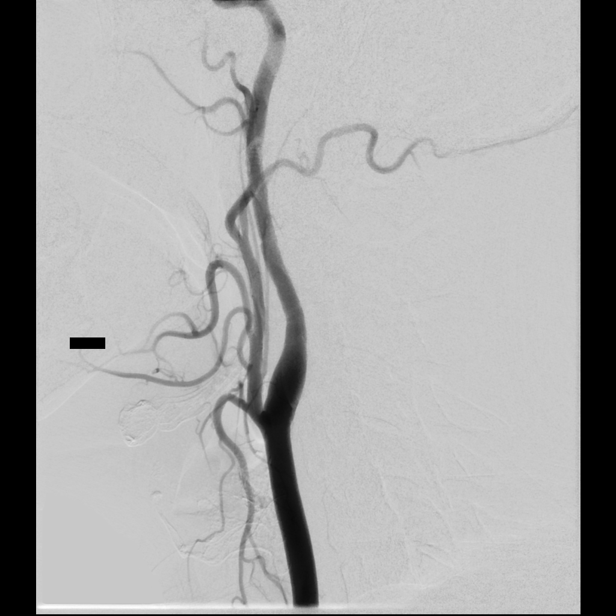
[im 99/253]
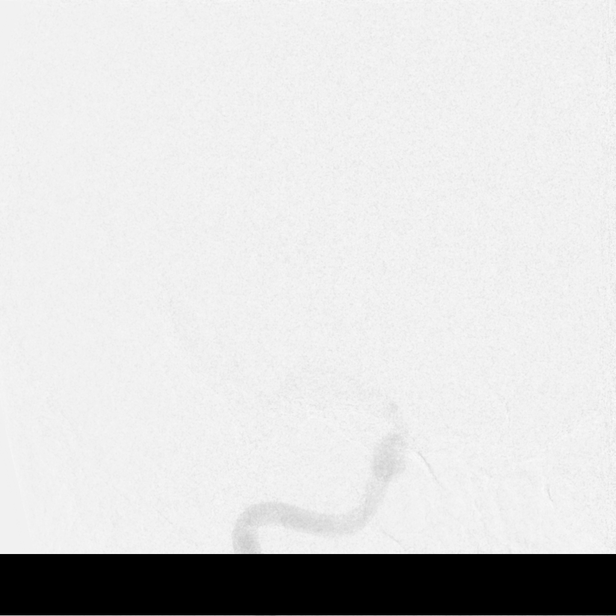
[im 132/253]
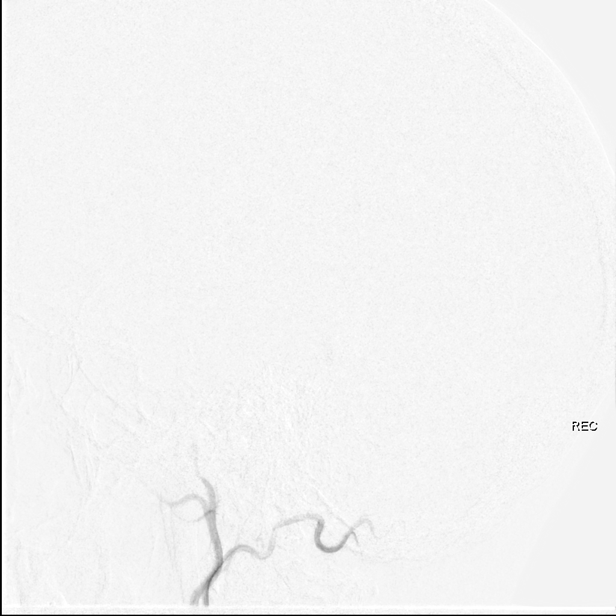
[im 154/253]
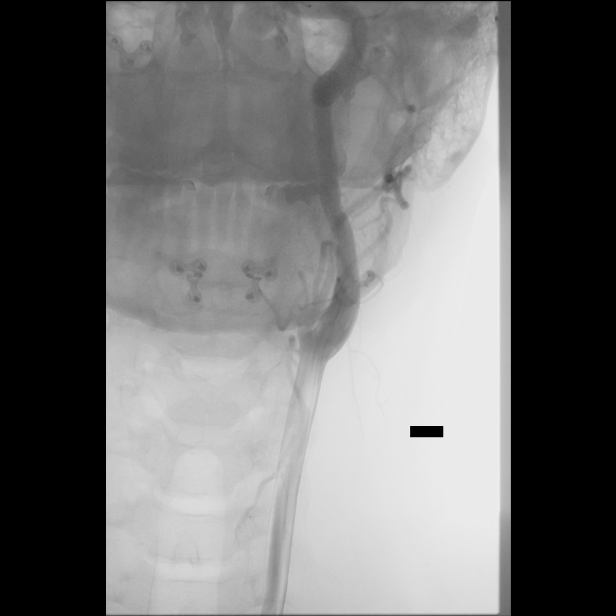
[im 176/253]
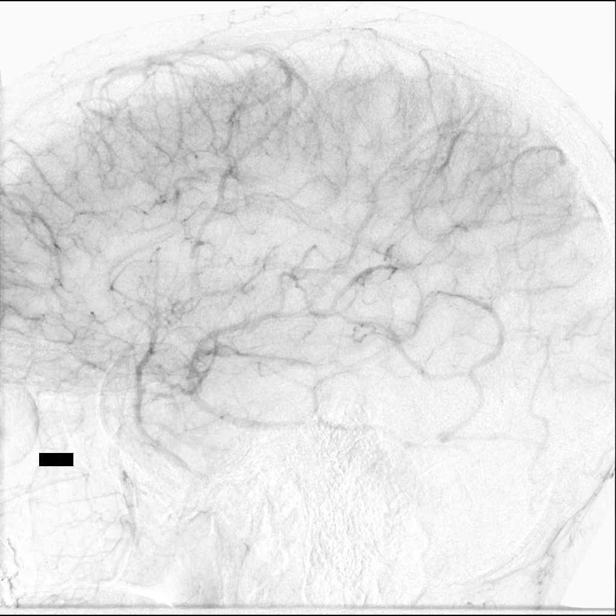
[im 198/253]
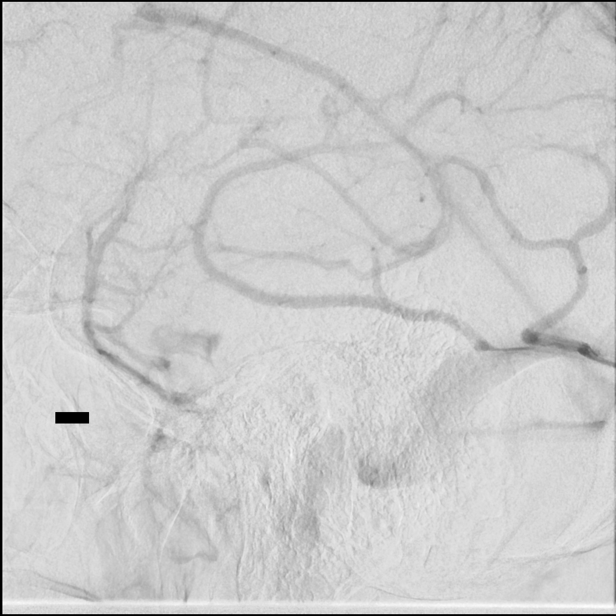
[im 220/253]
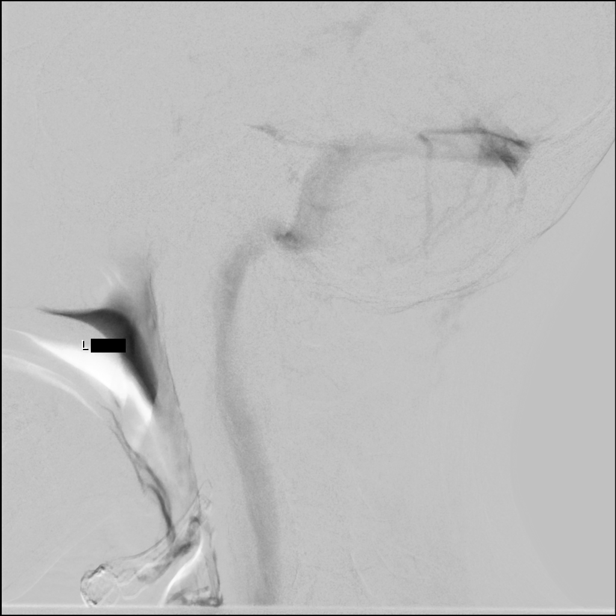
[im 242/253]
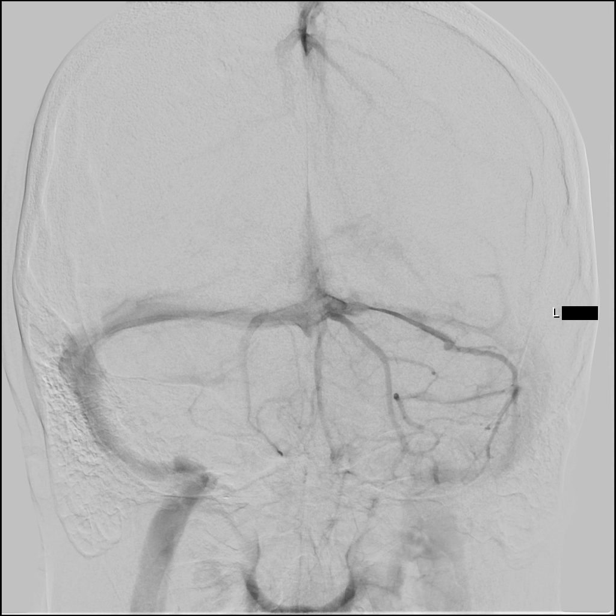

[11 of 24 positions shown; findings below may reference images not displayed]

MEDICATIONS:
Heparin 4222 units IV; no antibiotic was administered within 1 hour
of the procedure.

ANESTHESIA/SEDATION:
Versed 1 mg IV; Fentanyl 25 mcg IV

Moderate Sedation Time:  35 minutes

The patient was continuously monitored during the procedure by the
interventional radiology nurse under my direct supervision.

CONTRAST:  Isovue 300 approximately 60 mL.

FLUOROSCOPY TIME:  Fluoroscopy Time: 9 minutes 0 seconds (118 mGy).

COMPLICATIONS:
None immediate.
Initially the right radial artery approach was considered.
Ultrasonic evaluation and documentation revealed the vessel to be of
small caliber for a safe radial approach.

The right groin was prepped and draped in the usual sterile fashion.
Thereafter using modified Seldinger technique, transfemoral access
into the right common femoral artery was obtained without
difficulty. Over a 0.035 inch guidewire, a 5 French Pinnacle sheath
was inserted. Through this, and also over 0.035 inch guidewire, a 5
French JB 1 catheter was advanced to the aortic arch region and
selectively positioned in the right common carotid artery, the right
external carotid artery, the right vertebral artery, the left common
carotid artery and the left vertebral artery.
FINDINGS: The origin of the right vertebral artery is widely patent. The
vessel is seen to opacify to the cranial skull base. Opacification
of the right vertebrobasilar junction and of the right
anterior-inferior cerebellar artery/posterior-inferior cerebellar
artery complex is seen.

The basilar artery, the posterior cerebral arteries, the superior
cerebellar arteries and the anterior-inferior cerebellar artery on
the left, are seen to opacify into the capillary and venous phases.

Also noted is the early presence of a right occipital cortical
venous structure which proceeds superiorly and medially to drain
into the junction of the middle and distal superior sagittal sinus.

There is an early abnormal opacification of the distal right
transverse sinus associated with a single arterial feeder projecting
from the medial aspect. This appears to originate from the right
posterior cerebral artery P3 P4 region. The right common carotid
arteriogram demonstrates the right external carotid artery and its
major branches to be widely patent.

The right internal carotid artery at the bulb to the cranial skull
base demonstrates wide patency. The petrous, cavernous and the
supraclinoid segments are widely patent.

A right posterior communicating artery is seen opacifying the right
posterior cerebral artery distribution.

Again demonstrated is early opacification of a venous structure in
the distal right P3 P4 region associated with faint vascular blush.
Again this main structure is seen to project into the superior
sagittal sinus as described above.

The right middle cerebral artery opacifies into the capillary and
venous phases.

No right anterior cerebral A1 segment is visualized. The right
external carotid artery injection demonstrates wide patency of the
occipital artery, the temporal arteries and the middle meningeal
artery branches. No evidence of an arteriovenous fistulous
communication is seen from the occipital artery or the posterior
meningeal artery.

The left common carotid arteriogram demonstrates the left external
carotid artery and its major branches to be widely patent. The left
internal carotid artery at the bulb and its proximal [DATE] is widely
patent.

Again demonstrated is mild fusiform dilatation eccentrically
prominent posteriorly of the left internal carotid artery at the
level of the occipital bone.

More distally, the left internal carotid artery in the petrous,
cavernous and the supraclinoid segments is widely patent.

The left middle cerebral artery and the left anterior cerebral
artery opacify into the capillary and the venous phases.

Prompt cross-filling via the anterior communicating artery of the
right anterior cerebral A2 segment and distally is seen. Again no
angiographic evidence of a right anterior cerebral A1 segment is
seen.

The origin of the left vertebral artery is widely patent. The vessel
is seen to opacify to the cranial skull base. The left
vertebrobasilar junction and the left posterior-inferior cerebral
artery demonstrates wide patency.

The basilar artery, the left posterior cerebral artery, the superior
cerebellar arteries and the anterior-inferior cerebellar arteries
opacify into the capillary and the venous phases.

Again demonstrated is early opacification of a transverse sinus
supratentorial venous structure projecting into the superior
sagittal sinus as described above. Also demonstrated is the early
faint opacification of the distal right transverse sinus. It is
difficult to ascertain where the arterial feeder just at the site of
the distal right transverse sinus emanates from the right superior
cerebellar artery, or from the right posterior cerebral artery P3 P4
segment. Unopacified blood is seen in the basilar artery and the
right posterior cerebral artery.
IMPRESSION: Fast flow area of arteriovenous shunting just above the distal right
transverse sinus draining via cortical vein overlying the right
parieto-occipital region into the junction of the middle and the
inferior superior sagittal sinus.

Also possible drainage into the right transverse sinus.

Nonvisualization of the right anterior cerebral A1 segment may be
pathologic or developmental.

PLAN:
Patient is scheduled for MRI of the brain this month. Follow-up in
the clinic in 3-4 weeks.

Patient expressed understanding of the findings and also the
management plan.

Findings also were discussed with patient and spouse.

## 2020-04-19 ENCOUNTER — Other Ambulatory Visit: Payer: Self-pay | Admitting: Surgery

## 2020-04-19 DIAGNOSIS — N6081 Other benign mammary dysplasias of right breast: Secondary | ICD-10-CM

## 2020-04-28 ENCOUNTER — Other Ambulatory Visit: Payer: Self-pay | Admitting: Surgery

## 2020-04-28 DIAGNOSIS — N6081 Other benign mammary dysplasias of right breast: Secondary | ICD-10-CM

## 2020-05-06 ENCOUNTER — Encounter (HOSPITAL_BASED_OUTPATIENT_CLINIC_OR_DEPARTMENT_OTHER): Payer: Self-pay | Admitting: Surgery

## 2020-05-06 ENCOUNTER — Other Ambulatory Visit: Payer: Self-pay

## 2020-05-09 ENCOUNTER — Other Ambulatory Visit (HOSPITAL_COMMUNITY)
Admission: RE | Admit: 2020-05-09 | Discharge: 2020-05-09 | Disposition: A | Discharge status: Home / Self Care | Payer: BC Managed Care – PPO | Source: Ambulatory Visit | Attending: Surgery | Admitting: Surgery

## 2020-05-09 DIAGNOSIS — Z20822 Contact with and (suspected) exposure to covid-19: Secondary | ICD-10-CM | POA: Insufficient documentation

## 2020-05-09 DIAGNOSIS — N6489 Other specified disorders of breast: Secondary | ICD-10-CM | POA: Diagnosis not present

## 2020-05-09 DIAGNOSIS — Z01812 Encounter for preprocedural laboratory examination: Secondary | ICD-10-CM | POA: Insufficient documentation

## 2020-05-09 DIAGNOSIS — Z79899 Other long term (current) drug therapy: Secondary | ICD-10-CM | POA: Diagnosis not present

## 2020-05-09 DIAGNOSIS — Z923 Personal history of irradiation: Secondary | ICD-10-CM | POA: Diagnosis not present

## 2020-05-09 DIAGNOSIS — Z8673 Personal history of transient ischemic attack (TIA), and cerebral infarction without residual deficits: Secondary | ICD-10-CM | POA: Diagnosis not present

## 2020-05-10 LAB — SARS CORONAVIRUS 2 (TAT 6-24 HRS): SARS Coronavirus 2: NEGATIVE

## 2020-05-11 ENCOUNTER — Other Ambulatory Visit: Payer: Self-pay

## 2020-05-11 ENCOUNTER — Ambulatory Visit
Admission: RE | Admit: 2020-05-11 | Discharge: 2020-05-11 | Disposition: A | Discharge status: Home / Self Care | Payer: BC Managed Care – PPO | Source: Ambulatory Visit | Attending: Surgery | Admitting: Surgery

## 2020-05-11 DIAGNOSIS — N6081 Other benign mammary dysplasias of right breast: Secondary | ICD-10-CM

## 2020-05-11 DIAGNOSIS — R928 Other abnormal and inconclusive findings on diagnostic imaging of breast: Secondary | ICD-10-CM | POA: Diagnosis not present

## 2020-05-11 MED ORDER — ENSURE PRE-SURGERY PO LIQD: 296.0000 mL | Freq: Once | ORAL | Status: DC

## 2020-05-11 NOTE — Progress Notes (Signed)

## 2020-05-11 NOTE — H&P (Signed)
   Alveda Reasons Appointment: 04/19/2020 10:10 AM Location: [facility] Patient #: [id number] DOB: 11-25-1975 Married / Language: Cleophus Molt / Race: White Female   History of Present Illness (Jasman Pfeifle A. Ninfa Linden MD; 04/19/2020 10:15 AM) The patient is a 45 year old female who presents with a complaint of Breast problems.  She is here for a follow-up regarding these atypical cells in the right breast. She ended up having to go to [facility] and underwent radiation with the gamma knife to her brain for the AVM. She's been doing well since then. Again, she had a right breast biopsy last year showing atypical cells. Lumpectomy was recommended. Given her prior stroke, she had to have the AVM addressed first. She is now doing well. Again, she has a family history of an aunt who died of breast cancer.   Allergies Malachi Bonds, CMA; 04/19/2020 9:59 AM) No Known Drug Allergies  [09/01/2019]:  Medication History Malachi Bonds, CMA; 04/19/2020 9:59 AM) Atorvastatin Calcium (10MG  Tablet, Oral) Active. hydrOXYzine HCl (25MG  Tablet, Oral) Active. Sertraline HCl (100MG  Tablet, Oral) Active. Magnesium (300MG  Capsule, Oral) Active. Melatonin (5MG  Tablet Chewable, Oral) Active. Probiotic (Oral) Active. Vitamin C (500MG  Tablet Chewable, Oral) Active. Medications Reconciled    Physical Exam (Shontel Santee A. Ninfa Linden MD; 04/19/2020 10:15 AM) The physical exam findings are as follows: Note: She appears well on exam  Neurologically she is grossly intact    Assessment & Plan (Caroll Cunnington A. 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 Again, a radioactive seed guided right breast lumpectomy is recommended to remove this area for complete histologic evaluation to rule out malignancy. I again discussed the surgical procedure with her in detail. We discussed the risks and  procedures well. She understands and wished to proceed with surgery which will be scheduled.

## 2020-05-12 ENCOUNTER — Encounter (HOSPITAL_BASED_OUTPATIENT_CLINIC_OR_DEPARTMENT_OTHER): Payer: Self-pay | Admitting: Surgery

## 2020-05-12 ENCOUNTER — Ambulatory Visit (HOSPITAL_BASED_OUTPATIENT_CLINIC_OR_DEPARTMENT_OTHER)
Admission: RE | Admit: 2020-05-12 | Discharge: 2020-05-12 | Disposition: A | Discharge status: Home / Self Care | Payer: BC Managed Care – PPO | Attending: Surgery | Admitting: Surgery

## 2020-05-12 ENCOUNTER — Ambulatory Visit (HOSPITAL_BASED_OUTPATIENT_CLINIC_OR_DEPARTMENT_OTHER): Payer: BC Managed Care – PPO | Admitting: Certified Registered"

## 2020-05-12 ENCOUNTER — Ambulatory Visit
Admission: RE | Admit: 2020-05-12 | Discharge: 2020-05-12 | Disposition: A | Discharge status: Home / Self Care | Payer: BC Managed Care – PPO | Source: Ambulatory Visit | Attending: Surgery | Admitting: Surgery

## 2020-05-12 ENCOUNTER — Encounter (HOSPITAL_BASED_OUTPATIENT_CLINIC_OR_DEPARTMENT_OTHER)
Admission: RE | Disposition: A | Discharge status: Home / Self Care | Payer: Self-pay | Source: Home / Self Care | Attending: Surgery

## 2020-05-12 ENCOUNTER — Other Ambulatory Visit: Payer: Self-pay

## 2020-05-12 DIAGNOSIS — Z8673 Personal history of transient ischemic attack (TIA), and cerebral infarction without residual deficits: Secondary | ICD-10-CM | POA: Insufficient documentation

## 2020-05-12 DIAGNOSIS — Z20822 Contact with and (suspected) exposure to covid-19: Secondary | ICD-10-CM | POA: Insufficient documentation

## 2020-05-12 DIAGNOSIS — N6011 Diffuse cystic mastopathy of right breast: Secondary | ICD-10-CM | POA: Diagnosis not present

## 2020-05-12 DIAGNOSIS — N6081 Other benign mammary dysplasias of right breast: Secondary | ICD-10-CM

## 2020-05-12 DIAGNOSIS — N6091 Unspecified benign mammary dysplasia of right breast: Secondary | ICD-10-CM | POA: Diagnosis not present

## 2020-05-12 DIAGNOSIS — Z79899 Other long term (current) drug therapy: Secondary | ICD-10-CM | POA: Insufficient documentation

## 2020-05-12 DIAGNOSIS — I639 Cerebral infarction, unspecified: Secondary | ICD-10-CM | POA: Diagnosis not present

## 2020-05-12 DIAGNOSIS — F419 Anxiety disorder, unspecified: Secondary | ICD-10-CM | POA: Diagnosis not present

## 2020-05-12 DIAGNOSIS — Z923 Personal history of irradiation: Secondary | ICD-10-CM | POA: Insufficient documentation

## 2020-05-12 DIAGNOSIS — N6489 Other specified disorders of breast: Secondary | ICD-10-CM | POA: Diagnosis not present

## 2020-05-12 DIAGNOSIS — R928 Other abnormal and inconclusive findings on diagnostic imaging of breast: Secondary | ICD-10-CM | POA: Diagnosis not present

## 2020-05-12 DIAGNOSIS — E785 Hyperlipidemia, unspecified: Secondary | ICD-10-CM | POA: Diagnosis not present

## 2020-05-12 HISTORY — PX: BREAST LUMPECTOMY WITH RADIOACTIVE SEED LOCALIZATION: SHX6424

## 2020-05-12 LAB — POCT PREGNANCY, URINE: Preg Test, Ur: NEGATIVE

## 2020-05-12 SURGERY — BREAST LUMPECTOMY WITH RADIOACTIVE SEED LOCALIZATION
Anesthesia: General | Site: Breast | Laterality: Right

## 2020-05-12 MED ORDER — CEFAZOLIN SODIUM-DEXTROSE 2-4 GM/100ML-% IV SOLN
INTRAVENOUS | Status: AC
  Filled 2020-05-12: qty 100

## 2020-05-12 MED ORDER — 0.9 % SODIUM CHLORIDE (POUR BTL) OPTIME
TOPICAL | Status: DC | PRN
  Administered 2020-05-12: 1000 mL

## 2020-05-12 MED ORDER — FENTANYL CITRATE (PF) 100 MCG/2ML IJ SOLN
INTRAMUSCULAR | Status: AC
  Filled 2020-05-12: qty 2

## 2020-05-12 MED ORDER — FENTANYL CITRATE (PF) 100 MCG/2ML IJ SOLN
25.0000 ug | INTRAMUSCULAR | Status: DC | PRN
Start: 2020-05-12 — End: 2020-05-12

## 2020-05-12 MED ORDER — MIDAZOLAM HCL 5 MG/5ML IJ SOLN
INTRAMUSCULAR | Status: DC | PRN
  Administered 2020-05-12: 2 mg via INTRAVENOUS

## 2020-05-12 MED ORDER — DEXAMETHASONE SODIUM PHOSPHATE 10 MG/ML IJ SOLN
INTRAMUSCULAR | Status: DC | PRN
  Administered 2020-05-12: 5 mg via INTRAVENOUS

## 2020-05-12 MED ORDER — PROMETHAZINE HCL 25 MG/ML IJ SOLN: 6.2500 mg | INTRAMUSCULAR | Status: DC | PRN

## 2020-05-12 MED ORDER — CHLORHEXIDINE GLUCONATE CLOTH 2 % EX PADS: 6.0000 | MEDICATED_PAD | Freq: Once | CUTANEOUS | Status: DC

## 2020-05-12 MED ORDER — SCOPOLAMINE 1 MG/3DAYS TD PT72
1.0000 | MEDICATED_PATCH | TRANSDERMAL | Status: DC
  Administered 2020-05-12: 1.5 mg via TRANSDERMAL

## 2020-05-12 MED ORDER — FENTANYL CITRATE (PF) 250 MCG/5ML IJ SOLN
INTRAMUSCULAR | Status: DC | PRN
  Administered 2020-05-12 (×2): 25 ug via INTRAVENOUS

## 2020-05-12 MED ORDER — MIDAZOLAM HCL 2 MG/2ML IJ SOLN
INTRAMUSCULAR | Status: AC
  Filled 2020-05-12: qty 2

## 2020-05-12 MED ORDER — ACETAMINOPHEN 500 MG PO TABS
ORAL_TABLET | ORAL | Status: AC
  Filled 2020-05-12: qty 2

## 2020-05-12 MED ORDER — OXYCODONE HCL 5 MG/5ML PO SOLN
5.0000 mg | Freq: Once | ORAL | Status: DC | PRN
Start: 2020-05-12 — End: 2020-05-12

## 2020-05-12 MED ORDER — OXYCODONE HCL 5 MG PO TABS: 5.0000 mg | ORAL_TABLET | Freq: Once | ORAL | Status: DC | PRN

## 2020-05-12 MED ORDER — LIDOCAINE 2% (20 MG/ML) 5 ML SYRINGE
INTRAMUSCULAR | Status: DC | PRN
  Administered 2020-05-12: 80 mg via INTRAVENOUS

## 2020-05-12 MED ORDER — CEFAZOLIN SODIUM-DEXTROSE 2-4 GM/100ML-% IV SOLN
2.0000 g | INTRAVENOUS | Status: AC
  Administered 2020-05-12: 2 g via INTRAVENOUS

## 2020-05-12 MED ORDER — ACETAMINOPHEN 500 MG PO TABS
1000.0000 mg | ORAL_TABLET | ORAL | Status: AC
  Administered 2020-05-12: 1000 mg via ORAL

## 2020-05-12 MED ORDER — LACTATED RINGERS IV SOLN: INTRAVENOUS | Status: DC

## 2020-05-12 MED ORDER — BUPIVACAINE-EPINEPHRINE 0.5% -1:200000 IJ SOLN
INTRAMUSCULAR | Status: DC | PRN
  Administered 2020-05-12: 10 mL

## 2020-05-12 MED ORDER — SCOPOLAMINE 1 MG/3DAYS TD PT72
MEDICATED_PATCH | TRANSDERMAL | Status: AC
  Filled 2020-05-12: qty 1

## 2020-05-12 MED ORDER — PROPOFOL 10 MG/ML IV BOLUS
INTRAVENOUS | Status: AC
  Filled 2020-05-12: qty 20

## 2020-05-12 MED ORDER — ONDANSETRON HCL 4 MG/2ML IJ SOLN
INTRAMUSCULAR | Status: DC | PRN
  Administered 2020-05-12: 4 mg via INTRAVENOUS

## 2020-05-12 MED ORDER — TRAMADOL HCL 50 MG PO TABS: 50.0000 mg | ORAL_TABLET | Freq: Four times a day (QID) | ORAL | 0 refills | Status: DC | PRN

## 2020-05-12 MED ORDER — PROPOFOL 10 MG/ML IV BOLUS
INTRAVENOUS | Status: DC | PRN
  Administered 2020-05-12: 170 mg via INTRAVENOUS
  Administered 2020-05-12: 30 mg via INTRAVENOUS

## 2020-05-12 MED ORDER — AMISULPRIDE (ANTIEMETIC) 5 MG/2ML IV SOLN: 10.0000 mg | Freq: Once | INTRAVENOUS | Status: DC | PRN

## 2020-05-12 SURGICAL SUPPLY — 49 items
ADH SKN CLS APL DERMABOND .7 (GAUZE/BANDAGES/DRESSINGS) ×1
APL PRP STRL LF DISP 70% ISPRP (MISCELLANEOUS) ×1
APPLIER CLIP 9.375 MED OPEN (MISCELLANEOUS)
APR CLP MED 9.3 20 MLT OPN (MISCELLANEOUS)
BINDER BREAST 3XL (GAUZE/BANDAGES/DRESSINGS) IMPLANT
BINDER BREAST LRG (GAUZE/BANDAGES/DRESSINGS) IMPLANT
BINDER BREAST MEDIUM (GAUZE/BANDAGES/DRESSINGS) IMPLANT
BINDER BREAST XLRG (GAUZE/BANDAGES/DRESSINGS) IMPLANT
BINDER BREAST XXLRG (GAUZE/BANDAGES/DRESSINGS) IMPLANT
BLADE SURG 15 STRL LF DISP TIS (BLADE) ×1 IMPLANT
BLADE SURG 15 STRL SS (BLADE) ×2
CANISTER SUC SOCK COL 7IN (MISCELLANEOUS) IMPLANT
CANISTER SUCT 1200ML W/VALVE (MISCELLANEOUS) IMPLANT
CHLORAPREP W/TINT 26 (MISCELLANEOUS) ×2 IMPLANT
CLIP APPLIE 9.375 MED OPEN (MISCELLANEOUS) IMPLANT
COVER BACK TABLE 60X90IN (DRAPES) ×2 IMPLANT
COVER MAYO STAND STRL (DRAPES) ×2 IMPLANT
COVER PROBE W GEL 5X96 (DRAPES) ×2 IMPLANT
COVER WAND RF STERILE (DRAPES) IMPLANT
DECANTER SPIKE VIAL GLASS SM (MISCELLANEOUS) IMPLANT
DERMABOND ADVANCED (GAUZE/BANDAGES/DRESSINGS) ×1
DERMABOND ADVANCED .7 DNX12 (GAUZE/BANDAGES/DRESSINGS) ×1 IMPLANT
DRAPE LAPAROSCOPIC ABDOMINAL (DRAPES) ×2 IMPLANT
DRAPE UTILITY XL STRL (DRAPES) ×2 IMPLANT
ELECT REM PT RETURN 9FT ADLT (ELECTROSURGICAL) ×2
ELECTRODE REM PT RTRN 9FT ADLT (ELECTROSURGICAL) ×1 IMPLANT
GAUZE SPONGE 4X4 12PLY STRL LF (GAUZE/BANDAGES/DRESSINGS) IMPLANT
GLOVE SURG SIGNA 7.5 PF LTX (GLOVE) ×2 IMPLANT
GOWN STRL REUS W/ TWL LRG LVL3 (GOWN DISPOSABLE) ×1 IMPLANT
GOWN STRL REUS W/ TWL XL LVL3 (GOWN DISPOSABLE) ×1 IMPLANT
GOWN STRL REUS W/TWL LRG LVL3 (GOWN DISPOSABLE) ×2
GOWN STRL REUS W/TWL XL LVL3 (GOWN DISPOSABLE) ×2
KIT MARKER MARGIN INK (KITS) ×2 IMPLANT
NDL HYPO 25X1 1.5 SAFETY (NEEDLE) ×1 IMPLANT
NEEDLE HYPO 25X1 1.5 SAFETY (NEEDLE) ×2 IMPLANT
NS IRRIG 1000ML POUR BTL (IV SOLUTION) IMPLANT
PACK BASIN DAY SURGERY FS (CUSTOM PROCEDURE TRAY) ×2 IMPLANT
PENCIL SMOKE EVACUATOR (MISCELLANEOUS) ×2 IMPLANT
SLEEVE SCD COMPRESS KNEE MED (MISCELLANEOUS) ×2 IMPLANT
SPONGE LAP 4X18 RFD (DISPOSABLE) ×2 IMPLANT
SUT MNCRL AB 4-0 PS2 18 (SUTURE) ×2 IMPLANT
SUT SILK 2 0 SH (SUTURE) IMPLANT
SUT VIC AB 3-0 SH 27 (SUTURE) ×2
SUT VIC AB 3-0 SH 27X BRD (SUTURE) ×1 IMPLANT
SYR CONTROL 10ML LL (SYRINGE) ×2 IMPLANT
TOWEL GREEN STERILE FF (TOWEL DISPOSABLE) ×2 IMPLANT
TRAY FAXITRON CT DISP (TRAY / TRAY PROCEDURE) ×2 IMPLANT
TUBE CONNECTING 20X1/4 (TUBING) IMPLANT
YANKAUER SUCT BULB TIP NO VENT (SUCTIONS) IMPLANT

## 2020-05-12 NOTE — Interval H&P Note (Signed)
History and Physical Interval Note: no change in H and P  05/12/2020 7:14 AM  [name] [name]  has presented today for surgery, with the diagnosis of ATYPICAL CELLS RIGHT BREAST.  The various methods of treatment have been discussed with the patient and family. After consideration of risks, benefits and other options for treatment, the patient has consented to  Procedure(s) with comments: RIGHT BREAST LUMPECTOMY WITH RADIOACTIVE SEED LOCALIZATION (Right) - LMA as a surgical intervention.  The patient's history has been reviewed, patient examined, no change in status, stable for surgery.  I have reviewed the patient's chart and labs.  Questions were answered to the patient's satisfaction.     Coralie Keens

## 2020-05-12 NOTE — Op Note (Signed)
RIGHT BREAST LUMPECTOMY WITH RADIOACTIVE SEED LOCALIZATION  Procedure Note  [name] [name] 05/12/2020   Pre-op Diagnosis: ATYPICAL CELLS RIGHT BREAST     Post-op Diagnosis: same  Procedure(s): RIGHT BREAST LUMPECTOMY WITH RADIOACTIVE SEED LOCALIZATION  Surgeon(s): Coralie Keens, MD  Anesthesia: General  Staff:  Circulator: Gaetano Net, RN Scrub Person: Charisse March, RN; Maurene Capes, RN  Estimated Blood Loss: Minimal               Specimens: sent to path  Indications: This is a 45 year old female who was found last year to have an abnormality on screening mammography of the right breast.  She underwent a stereotactic biopsy which showed atypical cells.  Surgery had to be delayed for removal of this area for other medical reasons.  She now presents for a radioactive seed guided right breast lumpectomy  Procedure: Patient brought to operating room identifies correct patient.  She was placed upon the operating table general anesthesia was induced.  Her right breast was prepped and draped in usual sterile fashion.  The radioactive seed was located with the neoprobe in the upper outer quadrant of the breast near the axilla.  It was in the deep breast tissue.  Anesthetized the lateral edge of the areola with Marcaine.  I then made a circumareolar incision with a scalpel.  Then dissected down to the breast tissue with electrocautery.  With the aid of the neoprobe I then dissected toward the upper outer quadrant of the breast and then into the deep breast tissue.  Identified the radioactive seed in the deep upper outer quadrant breast tissue.  I then performed a lumpectomy staying around the seed with the cautery and aid of the neoprobe.  I then completed a lumpectomy and removed the specimen.  The seed was confirmed with the neoprobe to be in the specimen.  I marked the margins with pain.  An x-ray was performed confirming the radioactive seed and the previous biopsy clip  were in the specimen.  The lumpectomy specimen was then sent to pathology for evaluation.  I achieved hemostasis with cautery.  I anesthetized incision further with Marcaine.  I then closed the subcutaneous tissue with interrupted 3-0 Vicryl sutures and closed the skin with a running 4-0 Monocryl.  Dermabond was then applied.  The patient tolerated the procedure well.  All the counts were correct at the end of the procedure.  The patient was then extubated in the operating room and taken in a stable condition to the recovery room.          Coralie Keens   Date: 05/12/2020  Time: 8:54 AM

## 2020-05-12 NOTE — Anesthesia Postprocedure Evaluation (Signed)
Anesthesia Post Note  Patient: [name] [name]  Procedure(s) Performed: RIGHT BREAST LUMPECTOMY WITH RADIOACTIVE SEED LOCALIZATION (Right Breast)     Patient location during evaluation: PACU Anesthesia Type: General Level of consciousness: awake Pain management: pain level controlled Vital Signs Assessment: post-procedure vital signs reviewed and stable Respiratory status: spontaneous breathing and respiratory function stable Cardiovascular status: stable Postop Assessment: no apparent nausea or vomiting Anesthetic complications: no   No complications documented.  Last Vitals:  Vitals:   05/12/20 0927 05/12/20 0940  BP: 111/80 (!) 105/56  Pulse: 72 75  Resp: 17 16  Temp:  36.6 C  SpO2: 100% 98%    Last Pain:  Vitals:   05/12/20 0940  TempSrc:   PainSc: 0-No pain                 Merlinda Frederick

## 2020-05-12 NOTE — Anesthesia Preprocedure Evaluation (Addendum)
Anesthesia Evaluation  Patient identified by MRN, date of birth, ID band Patient awake    Reviewed: Allergy & Precautions, H&P , NPO status , Patient's Chart, lab work & pertinent test results  Airway Mallampati: II  TM Distance: >3 FB Neck ROM: Full    Dental no notable dental hx.    Pulmonary neg pulmonary ROS,    Pulmonary exam normal breath sounds clear to auscultation       Cardiovascular negative cardio ROS Normal cardiovascular exam Rhythm:Regular Rate:Normal     Neuro/Psych  Headaches, PSYCHIATRIC DISORDERS Anxiety H/o of ICH CVA (hemorrhagic; decreased vision), Residual Symptoms    GI/Hepatic negative GI ROS, Neg liver ROS,   Endo/Other  negative endocrine ROS  Renal/GU negative Renal ROS  negative genitourinary   Musculoskeletal negative musculoskeletal ROS (+)   Abdominal   Peds negative pediatric ROS (+)  Hematology negative hematology ROS (+)   Anesthesia Other Findings   Reproductive/Obstetrics negative OB ROS                            Anesthesia Physical Anesthesia Plan  ASA: II  Anesthesia Plan: General   Post-op Pain Management:    Induction: Intravenous  PONV Risk Score and Plan: 3 and Scopolamine patch - Pre-op, Midazolam, Dexamethasone, Ondansetron and Treatment may vary due to age or medical condition  Airway Management Planned: LMA  Additional Equipment: None  Intra-op Plan:   Post-operative Plan: Extubation in OR  Informed Consent: I have reviewed the patients History and Physical, chart, labs and discussed the procedure including the risks, benefits and alternatives for the proposed anesthesia with the patient or authorized representative who has indicated his/her understanding and acceptance.     Dental advisory given  Plan Discussed with: Anesthesiologist and CRNA  Anesthesia Plan Comments:        Anesthesia Quick Evaluation

## 2020-05-12 NOTE — Anesthesia Procedure Notes (Signed)
Procedure Name: LMA Insertion Date/Time: 05/12/2020 8:21 AM Performed by: Imagene Riches, CRNA Pre-anesthesia Checklist: Patient identified, Emergency Drugs available, Suction available and Patient being monitored Patient Re-evaluated:Patient Re-evaluated prior to induction Oxygen Delivery Method: Circle System Utilized Preoxygenation: Pre-oxygenation with 100% oxygen Induction Type: IV induction Ventilation: Mask ventilation without difficulty LMA: LMA inserted LMA Size: 4.0 Number of attempts: 1 Airway Equipment and Method: Bite block Placement Confirmation: positive ETCO2 Tube secured with: Tape Dental Injury: Teeth and Oropharynx as per pre-operative assessment

## 2020-05-12 NOTE — Transfer of Care (Signed)
Immediate Anesthesia Transfer of Care Note  Patient: [name] [name]  Procedure(s) Performed: RIGHT BREAST LUMPECTOMY WITH RADIOACTIVE SEED LOCALIZATION (Right Breast)  Patient Location: PACU  Anesthesia Type:General  Level of Consciousness: drowsy  Airway & Oxygen Therapy: Patient Spontanous Breathing and Patient connected to nasal cannula oxygen  Post-op Assessment: Report given to RN and Post -op Vital signs reviewed and stable  Post vital signs: Reviewed and stable  Last Vitals:  Vitals Value Taken Time  BP 101/65 05/12/20 0900  Temp    Pulse 83 05/12/20 0901  Resp 13 05/12/20 0901  SpO2 100 % 05/12/20 0901  Vitals shown include unvalidated device data.  Last Pain:  Vitals:   05/12/20 0717  TempSrc: Oral  PainSc: 0-No pain      Patients Stated Pain Goal: 4 (17/35/67 0141)  Complications: No complications documented.

## 2020-05-12 NOTE — Discharge Instructions (Signed)
[place] Office Phone Number [phone]  BREAST BIOPSY/ PARTIAL MASTECTOMY: POST OP INSTRUCTIONS  Always review your discharge instruction sheet given to you by the facility where your surgery was performed.  IF YOU HAVE DISABILITY OR FAMILY LEAVE FORMS, YOU MUST BRING THEM TO THE OFFICE FOR PROCESSING.  DO NOT GIVE THEM TO YOUR DOCTOR.  1. A prescription for pain medication may be given to you upon discharge.  Take your pain medication as prescribed, if needed.  If narcotic pain medicine is not needed, then you may take acetaminophen (Tylenol) or ibuprofen (Advil) as needed. 2. Take your usually prescribed medications unless otherwise directed 3. If you need a refill on your pain medication, please contact your pharmacy.  They will contact our office to request authorization.  Prescriptions will not be filled after 5pm or on week-ends. 4. You should eat very light the first 24 hours after surgery, such as soup, crackers, pudding, etc.  Resume your normal diet the day after surgery. 5. Most patients will experience some swelling and bruising in the breast.  Ice packs and a good support bra will help.  Swelling and bruising can take several days to resolve.  6. It is common to experience some constipation if taking pain medication after surgery.  Increasing fluid intake and taking a stool softener will usually help or prevent this problem from occurring.  A mild laxative (Milk of Magnesia or Miralax) should be taken according to package directions if there are no bowel movements after 48 hours. 7. Unless discharge instructions indicate otherwise, you may remove your bandages 24-48 hours after surgery, and you may shower at that time.  You may have steri-strips (small skin tapes) in place directly over the incision.  These strips should be left on the skin for 7-10 days.  If your surgeon used skin glue on the incision, you may shower in 24 hours.  The glue will flake off over the  next 2-3 weeks.  Any sutures or staples will be removed at the office during your follow-up visit. 8. ACTIVITIES:  You may resume regular daily activities (gradually increasing) beginning the next day.  Wearing a good support bra or sports bra minimizes pain and swelling.  You may have sexual intercourse when it is comfortable. a. You may drive when you no longer are taking prescription pain medication, you can comfortably wear a seatbelt, and you can safely maneuver your car and apply brakes. b. RETURN TO WORK:  ______________________________________________________________________________________ 9. You should see your doctor in the office for a follow-up appointment approximately two weeks after your surgery.  Your doctors nurse will typically make your follow-up appointment when she calls you with your pathology report.  Expect your pathology report 2-3 business days after your surgery.  You may call to check if you do not hear from [place] after three days. 10. OTHER INSTRUCTIONS:OK TO SHOWER STARTING TOMORROW 11. ICE PACK AND TYLENOL ALSO FOR PAIN 12. NO VIGOROUS ACTIVITY FOR 1 WEEK _______________________________________________________________________________________________ _____________________________________________________________________________________________________________________________________ _____________________________________________________________________________________________________________________________________ _____________________________________________________________________________________________________________________________________  WHEN TO CALL YOUR DOCTOR: 1. Fever over 101.0 2. Nausea and/or vomiting. 3. Extreme swelling or bruising. 4. Continued bleeding from incision. 5. Increased pain, redness, or drainage from the incision.  The clinic staff is available to answer your questions during regular business hours.  Please dont hesitate to call and ask to  speak to one of the nurses for clinical concerns.  If you have a medical emergency, go to the nearest emergency room or call 911.  A surgeon  from [facility] is always on call at the hospital.  For further questions, please visit [email or web address]    No Tylenol before 3:30pm if needed.  Post Anesthesia Home Care Instructions  Activity: Get plenty of rest for the remainder of the day. A responsible individual must stay with you for 24 hours following the procedure.  For the next 24 hours, DO NOT: -Drive a car -Paediatric nurse -Drink alcoholic beverages -Take any medication unless instructed by your physician -Make any legal decisions or sign important papers.  Meals: Start with liquid foods such as gelatin or soup. Progress to regular foods as tolerated. Avoid greasy, spicy, heavy foods. If nausea and/or vomiting occur, drink only clear liquids until the nausea and/or vomiting subsides. Call your physician if vomiting continues.  Special Instructions/Symptoms: Your throat may feel dry or sore from the anesthesia or the breathing tube placed in your throat during surgery. If this causes discomfort, gargle with warm salt water. The discomfort should disappear within 24 hours.  If you had a scopolamine patch placed behind your ear for the management of post- operative nausea and/or vomiting:  1. The medication in the patch is effective for 72 hours, after which it should be removed.  Wrap patch in a tissue and discard in the trash. Wash hands thoroughly with soap and water. 2. You may remove the patch earlier than 72 hours if you experience unpleasant side effects which may include dry mouth, dizziness or visual disturbances. 3. Avoid touching the patch. Wash your hands with soap and water after contact with the patch.

## 2020-05-13 LAB — SURGICAL PATHOLOGY

## 2020-05-16 ENCOUNTER — Encounter (HOSPITAL_BASED_OUTPATIENT_CLINIC_OR_DEPARTMENT_OTHER): Payer: Self-pay | Admitting: Surgery

## 2020-07-05 DIAGNOSIS — Q282 Arteriovenous malformation of cerebral vessels: Secondary | ICD-10-CM | POA: Diagnosis not present

## 2020-07-05 DIAGNOSIS — I619 Nontraumatic intracerebral hemorrhage, unspecified: Secondary | ICD-10-CM | POA: Diagnosis not present

## 2020-07-05 DIAGNOSIS — I629 Nontraumatic intracranial hemorrhage, unspecified: Secondary | ICD-10-CM | POA: Diagnosis not present

## 2020-07-05 DIAGNOSIS — G9389 Other specified disorders of brain: Secondary | ICD-10-CM | POA: Diagnosis not present

## 2020-07-05 DIAGNOSIS — H9311 Tinnitus, right ear: Secondary | ICD-10-CM | POA: Diagnosis not present

## 2020-08-04 DIAGNOSIS — Z1152 Encounter for screening for COVID-19: Secondary | ICD-10-CM | POA: Diagnosis not present

## 2020-09-12 IMAGING — MR MR MRA HEAD W/O CM
1 series · 19 of 48 positions shown · IV contrast (gadavist)
Comparison: 05/22/2019

CLINICAL DATA: Nontraumatic intracranial hemorrhage. Follow-up.
Dural AV fistula.

EXAM:
MRI HEAD WITHOUT AND WITH CONTRAST
MRA HEAD WITHOUT CONTRAST
TECHNIQUE: Multiplanar, multiecho pulse sequences of the brain and surrounding
structures were obtained without and with intravenous contrast.
Angiographic images of the head were obtained using MRA technique
without contrast.
CONTRAST:  7mL GADAVIST GADOBUTROL 1 MMOL/ML IV SOLN

[Series 3: ax (id) · axial · 1.0mm · 0.43mm/px · z∈[-61,+25]mm · 19 of 184 slices shown]
[im 1/184]
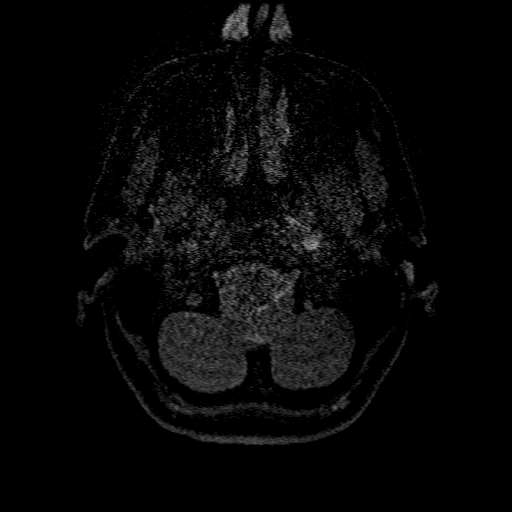
[im 4/184]
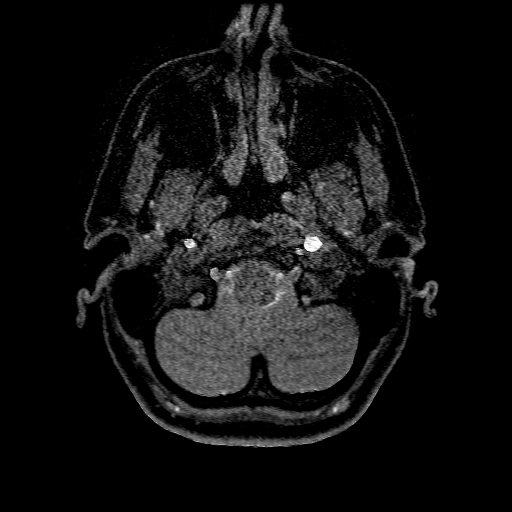
[im 8/184]
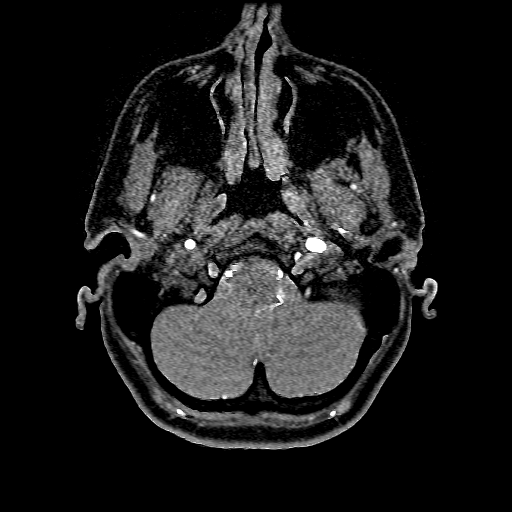
[im 12/184]
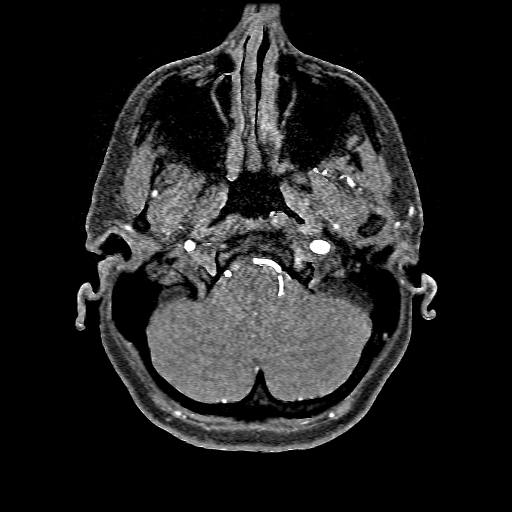
[im 16/184]
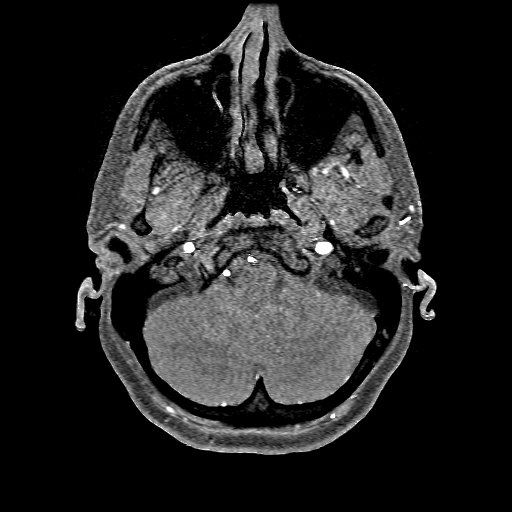
[im 20/184]
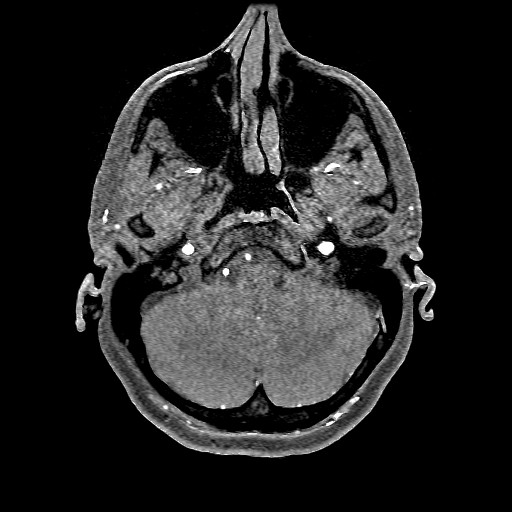
[im 24/184]
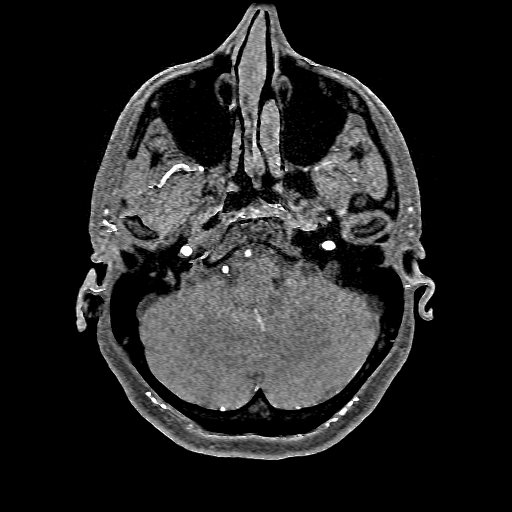
[im 28/184]
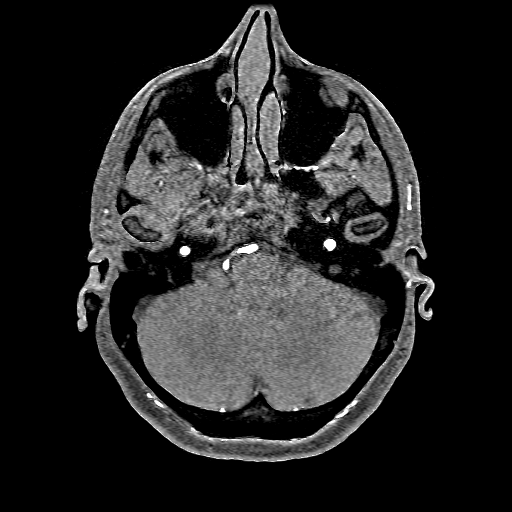
[im 32/184]
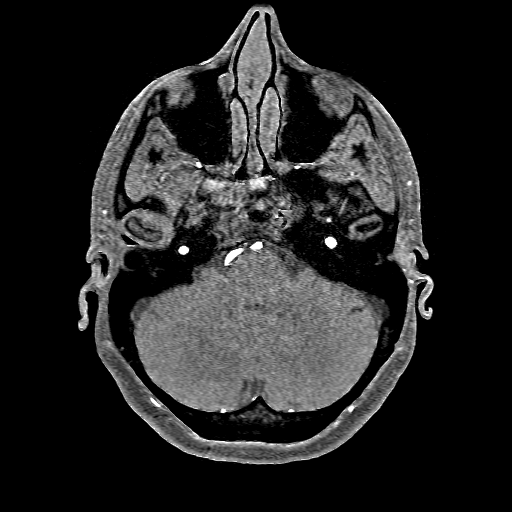
[im 36/184]
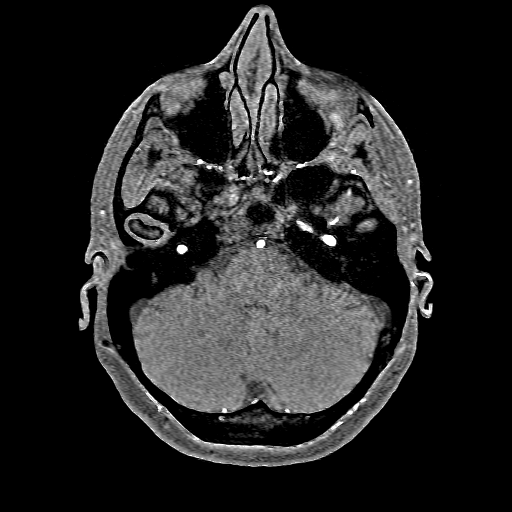
[im 39/184]
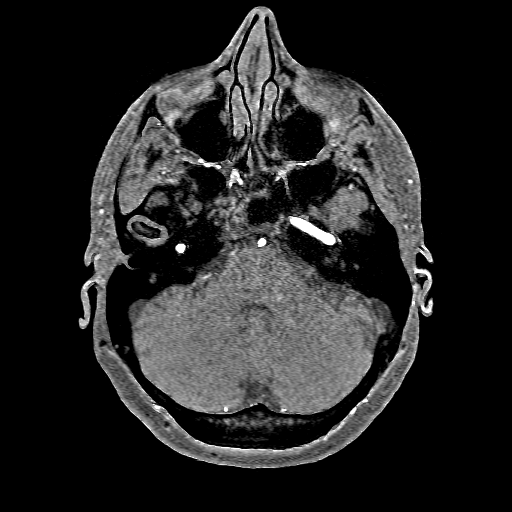
[im 59/184]
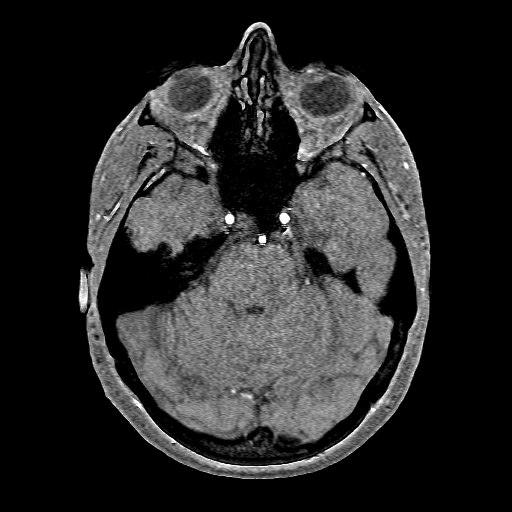
[im 82/184]
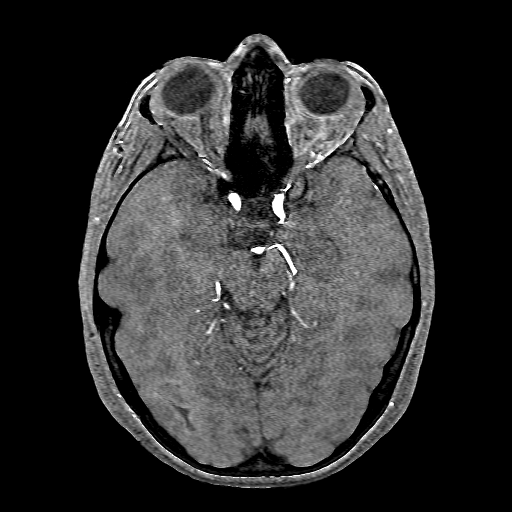
[im 94/184]
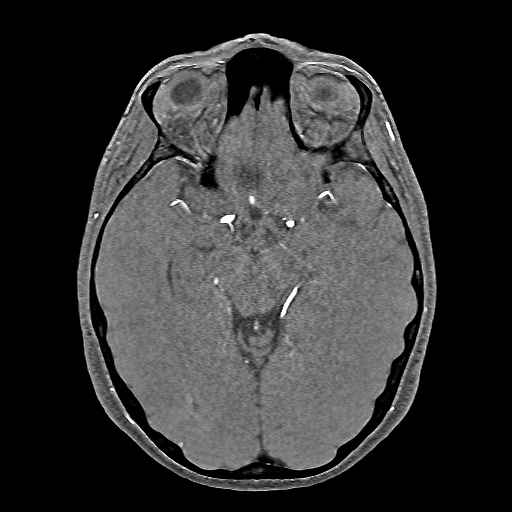
[im 106/184]
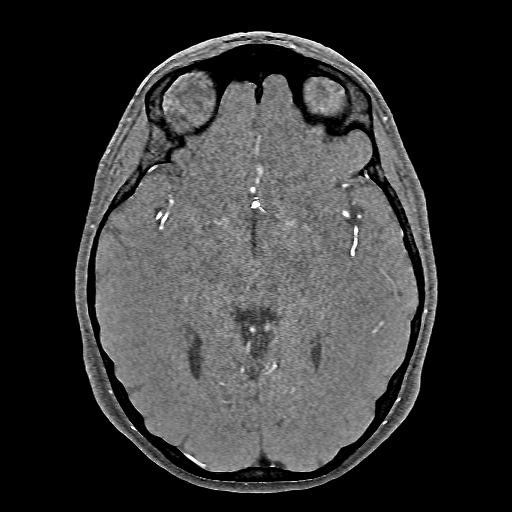
[im 129/184]
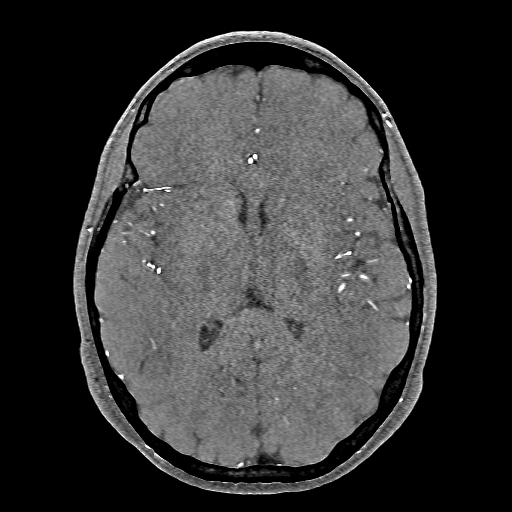
[im 152/184]
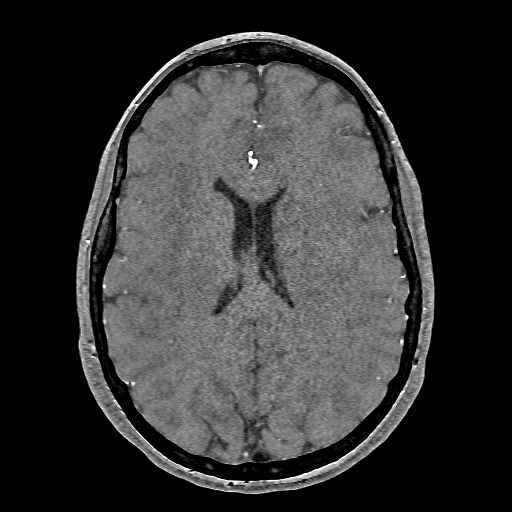
[im 156/184]
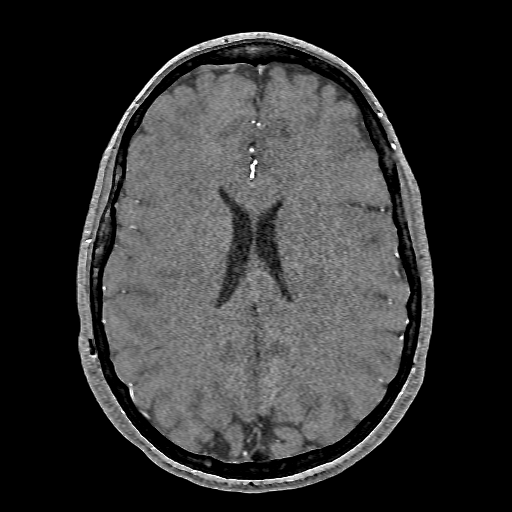
[im 176/184]
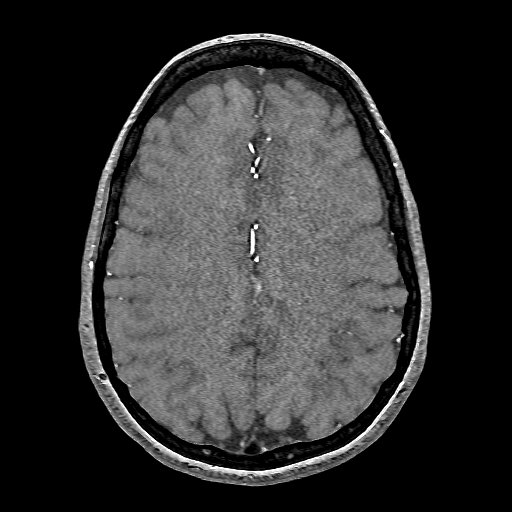

[19 of 48 positions shown; findings below may reference images not displayed]

FINDINGS: MRI HEAD FINDINGS

Brain: Diffusion imaging does not show any acute or subacute
infarction. Abnormality affects the brainstem or cerebellum.
Cerebral hemispheres show residua of an old hemorrhage in the right
occipital lobe, with some encephalomalacia and hemosiderin
deposition. No progressive change. The remainder of the cerebral
hemispheres appear normal. No evidence of mass, recent hemorrhage,
hydrocephalus or extra-axial collection. After contrast
administration, no abnormal enhancement occurs.

Vascular: Major vessels at the base of the brain show flow.

Skull and upper cervical spine: Negative

Sinuses/Orbits: Clear/normal

Other: None

MRA HEAD FINDINGS

Both internal carotid arteries are widely patent through the skull
base and siphon regions. The anterior and middle cerebral vessels
are patent without proximal stenosis, aneurysm or vascular
malformation. Aplastic A1 segment on the right, with both anterior
cerebral arteries receiving there supply from the left carotid
circulation.

Both vertebral arteries widely patent to the basilar. No basilar
stenosis. Posterior circulation branch vessels appear normal. No
evidence any high flow vascular abnormality in the region of the
right occipital hemorrhage. Some T2 bright blood products in the
region do not represent actual blood flow.
IMPRESSION: Continued evolutionary changes of the right occipital
intraparenchymal hematoma. Volume loss, gliosis and hemosiderin
deposition in that region. No unexpected or worsening finding. The
remainder the brain is normal.

Intracranial MR angiography does not show any high flow vascular
abnormality in the region of the right occipital hemorrhage. There
are some residual blood products in the area with some T1
hyperintensity, but this does not represent high flow arterial
signal.

## 2020-09-19 DIAGNOSIS — D2261 Melanocytic nevi of right upper limb, including shoulder: Secondary | ICD-10-CM | POA: Diagnosis not present

## 2020-09-19 DIAGNOSIS — D225 Melanocytic nevi of trunk: Secondary | ICD-10-CM | POA: Diagnosis not present

## 2020-09-19 DIAGNOSIS — D224 Melanocytic nevi of scalp and neck: Secondary | ICD-10-CM | POA: Diagnosis not present

## 2020-09-19 DIAGNOSIS — L720 Epidermal cyst: Secondary | ICD-10-CM | POA: Diagnosis not present

## 2020-10-05 DIAGNOSIS — F4323 Adjustment disorder with mixed anxiety and depressed mood: Secondary | ICD-10-CM | POA: Diagnosis not present

## 2020-10-17 DIAGNOSIS — F4323 Adjustment disorder with mixed anxiety and depressed mood: Secondary | ICD-10-CM | POA: Diagnosis not present

## 2020-10-19 DIAGNOSIS — Z124 Encounter for screening for malignant neoplasm of cervix: Secondary | ICD-10-CM | POA: Diagnosis not present

## 2020-10-19 DIAGNOSIS — Z1389 Encounter for screening for other disorder: Secondary | ICD-10-CM | POA: Diagnosis not present

## 2020-10-19 DIAGNOSIS — Z01419 Encounter for gynecological examination (general) (routine) without abnormal findings: Secondary | ICD-10-CM | POA: Diagnosis not present

## 2020-10-19 DIAGNOSIS — Z130 Encounter for screening for diseases of the blood and blood-forming organs and certain disorders involving the immune mechanism: Secondary | ICD-10-CM | POA: Diagnosis not present

## 2020-10-19 DIAGNOSIS — Z1231 Encounter for screening mammogram for malignant neoplasm of breast: Secondary | ICD-10-CM | POA: Diagnosis not present

## 2020-10-19 DIAGNOSIS — N763 Subacute and chronic vulvitis: Secondary | ICD-10-CM | POA: Diagnosis not present

## 2020-10-26 DIAGNOSIS — F4323 Adjustment disorder with mixed anxiety and depressed mood: Secondary | ICD-10-CM | POA: Diagnosis not present

## 2020-11-02 DIAGNOSIS — F4323 Adjustment disorder with mixed anxiety and depressed mood: Secondary | ICD-10-CM | POA: Diagnosis not present

## 2020-11-09 DIAGNOSIS — F4323 Adjustment disorder with mixed anxiety and depressed mood: Secondary | ICD-10-CM | POA: Diagnosis not present

## 2020-11-16 DIAGNOSIS — F4323 Adjustment disorder with mixed anxiety and depressed mood: Secondary | ICD-10-CM | POA: Diagnosis not present

## 2020-11-23 DIAGNOSIS — F4323 Adjustment disorder with mixed anxiety and depressed mood: Secondary | ICD-10-CM | POA: Diagnosis not present

## 2020-11-30 DIAGNOSIS — F4323 Adjustment disorder with mixed anxiety and depressed mood: Secondary | ICD-10-CM | POA: Diagnosis not present

## 2020-12-07 DIAGNOSIS — F4323 Adjustment disorder with mixed anxiety and depressed mood: Secondary | ICD-10-CM | POA: Diagnosis not present

## 2020-12-12 DIAGNOSIS — Z8673 Personal history of transient ischemic attack (TIA), and cerebral infarction without residual deficits: Secondary | ICD-10-CM | POA: Diagnosis not present

## 2020-12-12 DIAGNOSIS — Z03818 Encounter for observation for suspected exposure to other biological agents ruled out: Secondary | ICD-10-CM | POA: Diagnosis not present

## 2020-12-12 DIAGNOSIS — J069 Acute upper respiratory infection, unspecified: Secondary | ICD-10-CM | POA: Diagnosis not present

## 2020-12-12 DIAGNOSIS — E782 Mixed hyperlipidemia: Secondary | ICD-10-CM | POA: Diagnosis not present

## 2020-12-21 DIAGNOSIS — F4323 Adjustment disorder with mixed anxiety and depressed mood: Secondary | ICD-10-CM | POA: Diagnosis not present

## 2020-12-27 DIAGNOSIS — F4323 Adjustment disorder with mixed anxiety and depressed mood: Secondary | ICD-10-CM | POA: Diagnosis not present

## 2021-01-12 DIAGNOSIS — H539 Unspecified visual disturbance: Secondary | ICD-10-CM | POA: Diagnosis not present

## 2021-01-12 DIAGNOSIS — R413 Other amnesia: Secondary | ICD-10-CM | POA: Diagnosis not present

## 2021-01-12 DIAGNOSIS — R519 Headache, unspecified: Secondary | ICD-10-CM | POA: Diagnosis not present

## 2021-01-12 DIAGNOSIS — I770 Arteriovenous fistula, acquired: Secondary | ICD-10-CM | POA: Diagnosis not present

## 2021-01-12 DIAGNOSIS — Z48811 Encounter for surgical aftercare following surgery on the nervous system: Secondary | ICD-10-CM | POA: Diagnosis not present

## 2021-01-12 DIAGNOSIS — Q282 Arteriovenous malformation of cerebral vessels: Secondary | ICD-10-CM | POA: Diagnosis not present

## 2021-01-16 DIAGNOSIS — F4323 Adjustment disorder with mixed anxiety and depressed mood: Secondary | ICD-10-CM | POA: Diagnosis not present

## 2021-02-22 DIAGNOSIS — F4323 Adjustment disorder with mixed anxiety and depressed mood: Secondary | ICD-10-CM | POA: Diagnosis not present

## 2021-02-22 DIAGNOSIS — Z23 Encounter for immunization: Secondary | ICD-10-CM | POA: Diagnosis not present

## 2021-04-04 DIAGNOSIS — F4323 Adjustment disorder with mixed anxiety and depressed mood: Secondary | ICD-10-CM | POA: Diagnosis not present

## 2021-04-04 DIAGNOSIS — Z0000 Encounter for general adult medical examination without abnormal findings: Secondary | ICD-10-CM | POA: Diagnosis not present

## 2021-04-05 DIAGNOSIS — Z0000 Encounter for general adult medical examination without abnormal findings: Secondary | ICD-10-CM | POA: Diagnosis not present

## 2021-04-05 DIAGNOSIS — E782 Mixed hyperlipidemia: Secondary | ICD-10-CM | POA: Diagnosis not present

## 2021-05-10 DIAGNOSIS — F4323 Adjustment disorder with mixed anxiety and depressed mood: Secondary | ICD-10-CM | POA: Diagnosis not present

## 2021-06-01 DIAGNOSIS — F4323 Adjustment disorder with mixed anxiety and depressed mood: Secondary | ICD-10-CM | POA: Diagnosis not present

## 2021-06-29 DIAGNOSIS — F4323 Adjustment disorder with mixed anxiety and depressed mood: Secondary | ICD-10-CM | POA: Diagnosis not present

## 2021-07-13 DIAGNOSIS — I619 Nontraumatic intracerebral hemorrhage, unspecified: Secondary | ICD-10-CM | POA: Diagnosis not present

## 2021-07-13 DIAGNOSIS — I671 Cerebral aneurysm, nonruptured: Secondary | ICD-10-CM | POA: Diagnosis not present

## 2021-07-28 DIAGNOSIS — F4323 Adjustment disorder with mixed anxiety and depressed mood: Secondary | ICD-10-CM | POA: Diagnosis not present

## 2021-08-03 ENCOUNTER — Encounter (HOSPITAL_COMMUNITY): Payer: Self-pay

## 2021-08-17 ENCOUNTER — Other Ambulatory Visit: Payer: Self-pay | Admitting: Gastroenterology

## 2021-08-17 DIAGNOSIS — R1013 Epigastric pain: Secondary | ICD-10-CM | POA: Diagnosis not present

## 2021-08-17 DIAGNOSIS — Z1211 Encounter for screening for malignant neoplasm of colon: Secondary | ICD-10-CM | POA: Diagnosis not present

## 2021-08-17 DIAGNOSIS — R1011 Right upper quadrant pain: Secondary | ICD-10-CM

## 2021-08-24 ENCOUNTER — Ambulatory Visit
Admission: RE | Admit: 2021-08-24 | Discharge: 2021-08-24 | Disposition: A | Discharge status: Home / Self Care | Payer: BC Managed Care – PPO | Source: Ambulatory Visit | Attending: Gastroenterology | Admitting: Gastroenterology

## 2021-08-24 DIAGNOSIS — R109 Unspecified abdominal pain: Secondary | ICD-10-CM | POA: Diagnosis not present

## 2021-08-24 DIAGNOSIS — R1011 Right upper quadrant pain: Secondary | ICD-10-CM

## 2021-08-24 DIAGNOSIS — R1013 Epigastric pain: Secondary | ICD-10-CM

## 2021-08-24 DIAGNOSIS — K8020 Calculus of gallbladder without cholecystitis without obstruction: Secondary | ICD-10-CM | POA: Diagnosis not present

## 2021-09-04 DIAGNOSIS — K648 Other hemorrhoids: Secondary | ICD-10-CM | POA: Diagnosis not present

## 2021-09-04 DIAGNOSIS — Z1211 Encounter for screening for malignant neoplasm of colon: Secondary | ICD-10-CM | POA: Diagnosis not present

## 2021-09-11 DIAGNOSIS — R1013 Epigastric pain: Secondary | ICD-10-CM | POA: Diagnosis not present

## 2021-09-11 DIAGNOSIS — K8020 Calculus of gallbladder without cholecystitis without obstruction: Secondary | ICD-10-CM | POA: Diagnosis not present

## 2021-11-01 DIAGNOSIS — D2261 Melanocytic nevi of right upper limb, including shoulder: Secondary | ICD-10-CM | POA: Diagnosis not present

## 2021-11-01 DIAGNOSIS — D2271 Melanocytic nevi of right lower limb, including hip: Secondary | ICD-10-CM | POA: Diagnosis not present

## 2021-11-01 DIAGNOSIS — D1801 Hemangioma of skin and subcutaneous tissue: Secondary | ICD-10-CM | POA: Diagnosis not present

## 2021-11-01 DIAGNOSIS — D225 Melanocytic nevi of trunk: Secondary | ICD-10-CM | POA: Diagnosis not present

## 2021-11-01 DIAGNOSIS — D224 Melanocytic nevi of scalp and neck: Secondary | ICD-10-CM | POA: Diagnosis not present

## 2021-11-14 DIAGNOSIS — Z01419 Encounter for gynecological examination (general) (routine) without abnormal findings: Secondary | ICD-10-CM | POA: Diagnosis not present

## 2021-11-14 DIAGNOSIS — Z1389 Encounter for screening for other disorder: Secondary | ICD-10-CM | POA: Diagnosis not present

## 2021-11-15 DIAGNOSIS — Z1231 Encounter for screening mammogram for malignant neoplasm of breast: Secondary | ICD-10-CM | POA: Diagnosis not present

## 2021-11-23 ENCOUNTER — Other Ambulatory Visit (HOSPITAL_COMMUNITY): Payer: Self-pay | Admitting: Gastroenterology

## 2021-11-23 DIAGNOSIS — R1901 Right upper quadrant abdominal swelling, mass and lump: Secondary | ICD-10-CM

## 2021-11-23 DIAGNOSIS — R1011 Right upper quadrant pain: Secondary | ICD-10-CM | POA: Diagnosis not present

## 2021-12-04 ENCOUNTER — Encounter (HOSPITAL_COMMUNITY)
Admission: RE | Admit: 2021-12-04 | Discharge: 2021-12-04 | Disposition: A | Discharge status: Home / Self Care | Payer: BC Managed Care – PPO | Source: Ambulatory Visit | Attending: Gastroenterology | Admitting: Gastroenterology

## 2021-12-04 ENCOUNTER — Encounter (HOSPITAL_COMMUNITY): Payer: Self-pay

## 2021-12-04 DIAGNOSIS — R1901 Right upper quadrant abdominal swelling, mass and lump: Secondary | ICD-10-CM | POA: Insufficient documentation

## 2021-12-05 ENCOUNTER — Other Ambulatory Visit: Payer: Self-pay | Admitting: Obstetrics and Gynecology

## 2021-12-05 DIAGNOSIS — R928 Other abnormal and inconclusive findings on diagnostic imaging of breast: Secondary | ICD-10-CM

## 2021-12-25 ENCOUNTER — Ambulatory Visit
Admission: RE | Admit: 2021-12-25 | Discharge: 2021-12-25 | Disposition: A | Discharge status: Home / Self Care | Payer: BC Managed Care – PPO | Source: Ambulatory Visit | Attending: Obstetrics and Gynecology | Admitting: Obstetrics and Gynecology

## 2021-12-25 DIAGNOSIS — R928 Other abnormal and inconclusive findings on diagnostic imaging of breast: Secondary | ICD-10-CM

## 2021-12-25 DIAGNOSIS — R922 Inconclusive mammogram: Secondary | ICD-10-CM | POA: Diagnosis not present

## 2021-12-25 DIAGNOSIS — N6012 Diffuse cystic mastopathy of left breast: Secondary | ICD-10-CM | POA: Diagnosis not present

## 2021-12-25 DIAGNOSIS — N6011 Diffuse cystic mastopathy of right breast: Secondary | ICD-10-CM | POA: Diagnosis not present

## 2021-12-28 DIAGNOSIS — J029 Acute pharyngitis, unspecified: Secondary | ICD-10-CM | POA: Diagnosis not present

## 2021-12-28 DIAGNOSIS — R0989 Other specified symptoms and signs involving the circulatory and respiratory systems: Secondary | ICD-10-CM | POA: Diagnosis not present

## 2021-12-28 DIAGNOSIS — R519 Headache, unspecified: Secondary | ICD-10-CM | POA: Diagnosis not present

## 2021-12-28 DIAGNOSIS — J019 Acute sinusitis, unspecified: Secondary | ICD-10-CM | POA: Diagnosis not present

## 2021-12-28 DIAGNOSIS — R051 Acute cough: Secondary | ICD-10-CM | POA: Diagnosis not present

## 2021-12-28 DIAGNOSIS — J988 Other specified respiratory disorders: Secondary | ICD-10-CM | POA: Diagnosis not present

## 2022-01-02 ENCOUNTER — Other Ambulatory Visit: Payer: BC Managed Care – PPO

## 2022-01-11 DIAGNOSIS — I770 Arteriovenous fistula, acquired: Secondary | ICD-10-CM | POA: Diagnosis not present

## 2022-01-11 DIAGNOSIS — Q282 Arteriovenous malformation of cerebral vessels: Secondary | ICD-10-CM | POA: Diagnosis not present

## 2022-01-11 DIAGNOSIS — I671 Cerebral aneurysm, nonruptured: Secondary | ICD-10-CM | POA: Diagnosis not present

## 2022-04-12 DIAGNOSIS — R300 Dysuria: Secondary | ICD-10-CM | POA: Diagnosis not present

## 2022-04-12 DIAGNOSIS — Z0000 Encounter for general adult medical examination without abnormal findings: Secondary | ICD-10-CM | POA: Diagnosis not present

## 2022-04-12 DIAGNOSIS — E782 Mixed hyperlipidemia: Secondary | ICD-10-CM | POA: Diagnosis not present

## 2022-04-12 DIAGNOSIS — F5101 Primary insomnia: Secondary | ICD-10-CM | POA: Diagnosis not present

## 2022-04-12 DIAGNOSIS — F411 Generalized anxiety disorder: Secondary | ICD-10-CM | POA: Diagnosis not present

## 2022-09-05 ENCOUNTER — Ambulatory Visit (INDEPENDENT_AMBULATORY_CARE_PROVIDER_SITE_OTHER): Payer: 59 | Admitting: Podiatry

## 2022-09-05 DIAGNOSIS — M2022 Hallux rigidus, left foot: Secondary | ICD-10-CM

## 2022-09-05 DIAGNOSIS — Q667 Congenital pes cavus, unspecified foot: Secondary | ICD-10-CM

## 2022-09-05 DIAGNOSIS — Q6671 Congenital pes cavus, right foot: Secondary | ICD-10-CM

## 2022-09-05 DIAGNOSIS — Q6672 Congenital pes cavus, left foot: Secondary | ICD-10-CM

## 2022-09-05 NOTE — Progress Notes (Signed)
Subjective:  Patient ID: [name] [name], female    DOB: Sep 08, 1975,  MRN: [id number]  Chief Complaint  Patient presents with   Foot Pain    Left foot pain across the top    47 y.o. female presents with the above complaint.  Patient presents with complaint of discomfort to the left hallux first metatarsophalangeal joint.  She states that she started noticing discomfort for a little while.  Pain across the top of the foot.  She wanted to discuss treatment options for it.  She would like to hold off on the injection.  She does not wear any orthotics.  He has been going on for quite some time   Review of Systems: Negative except as noted in the HPI. Denies N/V/F/Ch.  Past Medical History:  Diagnosis Date   Anxiety    ICH (intracerebral hemorrhage) (HCC) 02/21/2019    Current Outpatient Medications:    acetaminophen (TYLENOL) 500 MG tablet, Take 1,000 mg by mouth every 8 (eight) hours as needed for moderate pain., Disp: , Rfl:    atorvastatin (LIPITOR) 10 MG tablet, Take 1 tablet (10 mg total) by mouth daily., Disp: 30 tablet, Rfl: 2   calcium carbonate (TUMS - DOSED IN MG ELEMENTAL CALCIUM) 500 MG chewable tablet, Chew 1-2 tablets by mouth daily as needed for indigestion or heartburn., Disp: , Rfl:    hydrOXYzine (ATARAX/VISTARIL) 25 MG tablet, Take 25 mg by mouth at bedtime. , Disp: , Rfl:    Melatonin 10 MG CAPS, Take 10 mg by mouth at bedtime., Disp: , Rfl:    sertraline (ZOLOFT) 100 MG tablet, Take 150 mg by mouth daily. , Disp: , Rfl:    traMADol (ULTRAM) 50 MG tablet, Take 1 tablet (50 mg total) by mouth every 6 (six) hours as needed for moderate pain or severe pain., Disp: 20 tablet, Rfl: 0  Social History   Tobacco Use  Smoking Status Never  Smokeless Tobacco Never    Allergies  Allergen Reactions   Other Nausea And Vomiting    "berries"   Objective:  There were no vitals filed for this visit. There is no height or weight on file to calculate BMI. Constitutional  Well developed. Well nourished.  Vascular Dorsalis pedis pulses palpable bilaterally. Posterior tibial pulses palpable bilaterally. Capillary refill normal to all digits.  No cyanosis or clubbing noted. Pedal hair growth normal.  Neurologic Normal speech. Oriented to person, place, and time. Epicritic sensation to light touch grossly present bilaterally.  Dermatologic Nails well groomed and normal in appearance. No open wounds. No skin lesions.  Orthopedic: Pain on palpation left hallux first metatarsophalangeal joint limited range of motion noted to the first MPJ.  Clinically able to appreciate the underlying arthritis is presently pain with at the end range of motion.  No crepitus clinically appreciated.   Radiographs: None Assessment:  No diagnosis found. Plan:  Patient was evaluated and treated and all questions answered.  Left hallux rigidus with underlying pes cavus deformity -All questions or concerns were discussed with the patient in extensive detail.  For now patient is able to manage the pain she would like to hold off on the steroid injection.  I discussed shoe gear modification orthotics management given that patient has a pes cavus foot structure putting excessive stress to the first metatarsophalangeal joint likely leading to pain I believe she will benefit from orthotics with reverse Morton's extension.  Patient agrees with plan like to proceed with orthotics  No follow-ups on file.  Left hallux rigidus orthotics with reverse Morton's extension pes cavus  No injection

## 2022-10-03 ENCOUNTER — Ambulatory Visit: Payer: 59

## 2022-10-03 DIAGNOSIS — M2022 Hallux rigidus, left foot: Secondary | ICD-10-CM

## 2022-10-03 DIAGNOSIS — Q667 Congenital pes cavus, unspecified foot: Secondary | ICD-10-CM

## 2022-10-03 NOTE — Progress Notes (Unsigned)
Patient presents today to pick up I pair of custom insoles.    Patient was dispensed 1 pair of custom orthotics. Fit was satisfactory, to the best we could, inserts would not fit in patients flats ,she is going to buy a deep seat shoe so that she can wear them. She will break them in wearing her tennis shoes at home. Instructions for break-in and wear was reviewed and a copy was given to the patient.

## 2022-11-16 ENCOUNTER — Ambulatory Visit: Payer: 59 | Admitting: Podiatry

## 2022-11-16 DIAGNOSIS — M7752 Other enthesopathy of left foot: Secondary | ICD-10-CM

## 2022-11-16 DIAGNOSIS — M2022 Hallux rigidus, left foot: Secondary | ICD-10-CM

## 2022-11-16 NOTE — Progress Notes (Signed)
  Subjective:  Patient ID: [name] [name], female    DOB: 1975-07-17,  MRN: [id number]  Chief Complaint  Patient presents with   Foot Pain    Pt stated that she still has some discomfort she stated that the orthotics do help some     47 y.o. female presents with the above complaint.  Patient is with complaint left hallux rigidus.  She states that she is starting to hurt a little bit.  The orthotics do help some she would like to discuss next treatment plan including steroid injection   Review of Systems: Negative except as noted in the HPI. Denies N/V/F/Ch.  Past Medical History:  Diagnosis Date   Anxiety    ICH (intracerebral hemorrhage) (HCC) 02/21/2019    Current Outpatient Medications:    acetaminophen (TYLENOL) 500 MG tablet, Take 1,000 mg by mouth every 8 (eight) hours as needed for moderate pain., Disp: , Rfl:    atorvastatin (LIPITOR) 10 MG tablet, Take 1 tablet (10 mg total) by mouth daily., Disp: 30 tablet, Rfl: 2   calcium carbonate (TUMS - DOSED IN MG ELEMENTAL CALCIUM) 500 MG chewable tablet, Chew 1-2 tablets by mouth daily as needed for indigestion or heartburn., Disp: , Rfl:    hydrOXYzine (ATARAX/VISTARIL) 25 MG tablet, Take 25 mg by mouth at bedtime. , Disp: , Rfl:    Melatonin 10 MG CAPS, Take 10 mg by mouth at bedtime., Disp: , Rfl:    sertraline (ZOLOFT) 100 MG tablet, Take 150 mg by mouth daily. , Disp: , Rfl:    traMADol (ULTRAM) 50 MG tablet, Take 1 tablet (50 mg total) by mouth every 6 (six) hours as needed for moderate pain or severe pain., Disp: 20 tablet, Rfl: 0  Social History   Tobacco Use  Smoking Status Never  Smokeless Tobacco Never    Allergies  Allergen Reactions   Other Nausea And Vomiting    "berries"   Objective:  There were no vitals filed for this visit. There is no height or weight on file to calculate BMI. Constitutional Well developed. Well nourished.  Vascular Dorsalis pedis pulses palpable bilaterally. Posterior tibial  pulses palpable bilaterally. Capillary refill normal to all digits.  No cyanosis or clubbing noted. Pedal hair growth normal.  Neurologic Normal speech. Oriented to person, place, and time. Epicritic sensation to light touch grossly present bilaterally.  Dermatologic Nails well groomed and normal in appearance. No open wounds. No skin lesions.  Orthopedic: Pain on palpation left hallux first metatarsophalangeal joint limited range of motion noted to the first MPJ.  Clinically able to appreciate the underlying arthritis is presently pain with at the end range of motion.  No crepitus clinically appreciated.   Radiographs: None Assessment:   1. Hallux rigidus of left foot   2. Capsulitis of metatarsophalangeal (MTP) joint of left foot    Plan:  Patient was evaluated and treated and all questions answered.  Left hallux rigidus with underlying metatarsophalangeal joint capsulitis -All questions and concerns were discussed with the patient in extensive detail -Given the amount of pain that she is having she will benefit from steroid injection help decrease inflammatory component surgical pain.  Patient agrees with plan like to proceed with steroid injection -Patient's orthotics are functioning well.  No complication noted.

## 2022-12-14 ENCOUNTER — Ambulatory Visit: Payer: 59 | Admitting: Podiatry

## 2022-12-14 DIAGNOSIS — M2022 Hallux rigidus, left foot: Secondary | ICD-10-CM

## 2022-12-14 DIAGNOSIS — M7752 Other enthesopathy of left foot: Secondary | ICD-10-CM

## 2022-12-14 NOTE — Progress Notes (Signed)
  Subjective:  Patient ID: [name] [name], female    DOB: 10/04/1975,  MRN: [id number]  Chief Complaint  Patient presents with   Hallux rigidus of left foot    Pt stated that it is doing better     47 y.o. female presents with the above complaint.  Patient is with complaint left hallux rigidus.  She states is doing better.  Injection helped considerably.  She still has some residual pain denies any other acute complaints   Review of Systems: Negative except as noted in the HPI. Denies N/V/F/Ch.  Past Medical History:  Diagnosis Date   Anxiety    ICH (intracerebral hemorrhage) (HCC) 02/21/2019    Current Outpatient Medications:    acetaminophen (TYLENOL) 500 MG tablet, Take 1,000 mg by mouth every 8 (eight) hours as needed for moderate pain., Disp: , Rfl:    atorvastatin (LIPITOR) 10 MG tablet, Take 1 tablet (10 mg total) by mouth daily., Disp: 30 tablet, Rfl: 2   calcium carbonate (TUMS - DOSED IN MG ELEMENTAL CALCIUM) 500 MG chewable tablet, Chew 1-2 tablets by mouth daily as needed for indigestion or heartburn., Disp: , Rfl:    hydrOXYzine (ATARAX/VISTARIL) 25 MG tablet, Take 25 mg by mouth at bedtime. , Disp: , Rfl:    Melatonin 10 MG CAPS, Take 10 mg by mouth at bedtime., Disp: , Rfl:    sertraline (ZOLOFT) 100 MG tablet, Take 150 mg by mouth daily. , Disp: , Rfl:    traMADol (ULTRAM) 50 MG tablet, Take 1 tablet (50 mg total) by mouth every 6 (six) hours as needed for moderate pain or severe pain., Disp: 20 tablet, Rfl: 0  Social History   Tobacco Use  Smoking Status Never  Smokeless Tobacco Never    Allergies  Allergen Reactions   Other Nausea And Vomiting    "berries"   Objective:  There were no vitals filed for this visit. There is no height or weight on file to calculate BMI. Constitutional Well developed. Well nourished.  Vascular Dorsalis pedis pulses palpable bilaterally. Posterior tibial pulses palpable bilaterally. Capillary refill normal to all  digits.  No cyanosis or clubbing noted. Pedal hair growth normal.  Neurologic Normal speech. Oriented to person, place, and time. Epicritic sensation to light touch grossly present bilaterally.  Dermatologic Nails well groomed and normal in appearance. No open wounds. No skin lesions.  Orthopedic: Pain on palpation left hallux first metatarsophalangeal joint limited range of motion noted to the first MPJ.  Clinically able to appreciate the underlying arthritis is presently pain with at the end range of motion.  No crepitus clinically appreciated.   Radiographs: None Assessment:   1. Hallux rigidus of left foot   2. Capsulitis of metatarsophalangeal (MTP) joint of left foot     Plan:  Patient was evaluated and treated and all questions answered.  Left hallux rigidus with underlying metatarsophalangeal joint capsulitis -All questions and concerns were discussed with the patient in extensive detail -Given the amount of pain that she is having she will benefit from steroid injection help decrease inflammatory component surgical pain.  Patient agrees with plan like to proceed with steroid injection -Second steroid injection was performed at left first metatarsophalangeal joint using 1% plain Lidocaine and 10 mg of Kenalog. This was well tolerated. -Patient's orthotics are functioning well.  No complication noted.

## 2023-05-29 ENCOUNTER — Ambulatory Visit: Payer: Self-pay | Admitting: Surgery

## 2023-05-29 NOTE — H&P (View-Only) (Signed)
 History of Present Illness: [name] [name] is a 48 y.o. female who is seen today for follow up of gallstones.  She was initially seen by me in June 2023.  Then she has continued to have intermittent episodes of severe abdominal pain.  These usually occur every 1 to 2 months, each episode can last for 1 to 2 days.  In between episodes she has no symptoms.  The pain always starts after eating, although she has not identified any specific foods that reliably cause symptoms.  She has tried a PPI which is not helped with the pain.  The pain is focused in the right upper quadrant but radiates across the upper abdomen.  She does not have vomiting with the episodes of pain.  She was initially reluctant to have surgery as she feels well in between episodes, but would like to discuss surgery as the pain is now very severe when she has it.  Previous abdominal surgeries include 2 C-sections.  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 She follows at [facility] and supposed to have an MRI next month. She is not on any blood thinners.     Review of Systems: A complete review of systems was obtained from the patient.  I have reviewed this information and discussed as appropriate with the patient.  See HPI as well for other ROS.    Medical History: Past Medical History: Diagnosis Date  Anxiety   Cerebrovascular dural AV fistula   History of stroke   Intracerebral hemorrhage (CMS-HCC)    Patient Active Problem List Diagnosis  Epigastric pain  Calculus of gallbladder without cholecystitis without obstruction   Past Surgical History: Procedure Laterality Date  gamma knife brain sx  2021  MASTECTOMY PARTIAL / LUMPECTOMY  2022    No Known Allergies  Current Outpatient Medications on File Prior to Visit Medication Sig Dispense Refill  atorvastatin (LIPITOR) 10 MG tablet atorvastatin 10 mg tablet  TAKE 1 TABLET BY  MOUTH EVERY DAY    sertraline (ZOLOFT) 100 MG tablet sertraline 100 mg tablet  TAKE 1 & 1/2 TABLET EVERY DAY    No current facility-administered medications on file prior to visit.   Family History Problem Relation Age of Onset  High blood pressure (Hypertension) Mother   Diabetes Father   High blood pressure (Hypertension) Father   High blood pressure (Hypertension) Brother   Coronary Artery Disease (Blocked arteries around heart) Brother     Social History  Tobacco Use Smoking Status Never Smokeless Tobacco Never    Social History  Socioeconomic History  Marital status: Married Tobacco Use  Smoking status: Never  Smokeless tobacco: Never Substance and Sexual Activity  Alcohol use: Never  Drug use: Never  Social Drivers of Health  Transportation Needs: No Transportation Needs (03/11/2019)  Received from [facility] - Transportation   Lack of Transportation (Medical): No   Lack of Transportation (Non-Medical): No Housing Stability: Unknown (05/29/2023)  Housing Stability Vital Sign   Homeless in the Last Year: No   Objective:   Vitals:  05/29/23 1045 Pulse: 92 Temp: 36.8 C (98.2 F) SpO2: 99% Weight: 80 kg (176 lb 6.4 oz) PainSc: 0-No pain   Body mass index is 27.63 kg/m.  Physical Exam Vitals reviewed.  Constitutional:      General: She is not in acute distress.    Appearance: Normal appearance.  Eyes:     General: No  scleral icterus. Pulmonary:     Effort: Pulmonary effort is normal. No respiratory distress.  Abdominal:     General: There is no distension.     Palpations: Abdomen is soft.     Comments: Mild right sided tenderness to palpation.  No surgical scars in the upper abdomen.  Musculoskeletal:        General: Normal range of motion.  Skin:    General: Skin is warm and dry.     Coloration: Skin is not jaundiced.  Neurological:     General: No focal deficit present.     Mental Status: She is alert and oriented to person,  place, and time.         Assessment and Plan:    Diagnoses and all orders for this visit:  Calculus of gallbladder without cholecystitis without obstruction -     CCS Case Posting Request; Future  Encounter for follow-up examination  Biliary colic    48 year old female with right upper quadrant pain usually precipitated by eating.  She has known cholelithiasis, confirmed on previous ultrasound in 2023.  I suspect her symptoms are related to the gallbladder, and she has not had relief with PPI therapy.  As the symptoms have persisted and are usually very severe, I offered cholecystectomy and she would like to proceed. Laparoscopic cholecystectomy was recommended. The details of this procedure were discussed with the patient, including the risks of bleeding, infection, bile leak, and <0.5% risk of common bile duct injury. The patient expressed understanding and agrees to proceed with surgery.  She will be contacted to schedule an elective surgery date.  All questions were answered.  Sophronia Simas, MD [facility] Surgery General, Hepatobiliary and Pancreatic Surgery 05/29/23 12:18 PM

## 2023-05-29 NOTE — H&P (Signed)
 History of Present Illness: [name] [name] is a 48 y.o. female who is seen today for follow up of gallstones.  She was initially seen by me in June 2023.  Then she has continued to have intermittent episodes of severe abdominal pain.  These usually occur every 1 to 2 months, each episode can last for 1 to 2 days.  In between episodes she has no symptoms.  The pain always starts after eating, although she has not identified any specific foods that reliably cause symptoms.  She has tried a PPI which is not helped with the pain.  The pain is focused in the right upper quadrant but radiates across the upper abdomen.  She does not have vomiting with the episodes of pain.  She was initially reluctant to have surgery as she feels well in between episodes, but would like to discuss surgery as the pain is now very severe when she has it.  Previous abdominal surgeries include 2 C-sections.  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 She follows at [facility] and supposed to have an MRI next month. She is not on any blood thinners.     Review of Systems: A complete review of systems was obtained from the patient.  I have reviewed this information and discussed as appropriate with the patient.  See HPI as well for other ROS.    Medical History: Past Medical History: Diagnosis Date  Anxiety   Cerebrovascular dural AV fistula   History of stroke   Intracerebral hemorrhage (CMS-HCC)    Patient Active Problem List Diagnosis  Epigastric pain  Calculus of gallbladder without cholecystitis without obstruction   Past Surgical History: Procedure Laterality Date  gamma knife brain sx  2021  MASTECTOMY PARTIAL / LUMPECTOMY  2022    No Known Allergies  Current Outpatient Medications on File Prior to Visit Medication Sig Dispense Refill  atorvastatin (LIPITOR) 10 MG tablet atorvastatin 10 mg tablet  TAKE 1 TABLET BY  MOUTH EVERY DAY    sertraline (ZOLOFT) 100 MG tablet sertraline 100 mg tablet  TAKE 1 & 1/2 TABLET EVERY DAY    No current facility-administered medications on file prior to visit.   Family History Problem Relation Age of Onset  High blood pressure (Hypertension) Mother   Diabetes Father   High blood pressure (Hypertension) Father   High blood pressure (Hypertension) Brother   Coronary Artery Disease (Blocked arteries around heart) Brother     Social History  Tobacco Use Smoking Status Never Smokeless Tobacco Never    Social History  Socioeconomic History  Marital status: Married Tobacco Use  Smoking status: Never  Smokeless tobacco: Never Substance and Sexual Activity  Alcohol use: Never  Drug use: Never  Social Drivers of Health  Transportation Needs: No Transportation Needs (03/11/2019)  Received from [facility] - Transportation   Lack of Transportation (Medical): No   Lack of Transportation (Non-Medical): No Housing Stability: Unknown (05/29/2023)  Housing Stability Vital Sign   Homeless in the Last Year: No   Objective:   Vitals:  05/29/23 1045 Pulse: 92 Temp: 36.8 C (98.2 F) SpO2: 99% Weight: 80 kg (176 lb 6.4 oz) PainSc: 0-No pain   Body mass index is 27.63 kg/m.  Physical Exam Vitals reviewed.  Constitutional:      General: She is not in acute distress.    Appearance: Normal appearance.  Eyes:     General: No  scleral icterus. Pulmonary:     Effort: Pulmonary effort is normal. No respiratory distress.  Abdominal:     General: There is no distension.     Palpations: Abdomen is soft.     Comments: Mild right sided tenderness to palpation.  No surgical scars in the upper abdomen.  Musculoskeletal:        General: Normal range of motion.  Skin:    General: Skin is warm and dry.     Coloration: Skin is not jaundiced.  Neurological:     General: No focal deficit present.     Mental Status: She is alert and oriented to person,  place, and time.         Assessment and Plan:    Diagnoses and all orders for this visit:  Calculus of gallbladder without cholecystitis without obstruction -     CCS Case Posting Request; Future  Encounter for follow-up examination  Biliary colic    48 year old female with right upper quadrant pain usually precipitated by eating.  She has known cholelithiasis, confirmed on previous ultrasound in 2023.  I suspect her symptoms are related to the gallbladder, and she has not had relief with PPI therapy.  As the symptoms have persisted and are usually very severe, I offered cholecystectomy and she would like to proceed. Laparoscopic cholecystectomy was recommended. The details of this procedure were discussed with the patient, including the risks of bleeding, infection, bile leak, and <0.5% risk of common bile duct injury. The patient expressed understanding and agrees to proceed with surgery.  She will be contacted to schedule an elective surgery date.  All questions were answered.  Sophronia Simas, MD [facility] Surgery General, Hepatobiliary and Pancreatic Surgery 05/29/23 12:18 PM

## 2023-06-12 NOTE — Pre-Procedure Instructions (Signed)
 Surgical Instructions   Your procedure is scheduled on Thursday, March 27th. Report to [facility] Main Entrance "A" at 05:30 A.M., then check in with the Admitting office. Any questions or running late day of surgery: call [phone]  Questions prior to your surgery date: call [phone], Monday-Friday, 8am-4pm. If you experience any cold or flu symptoms such as cough, fever, chills, shortness of breath, etc. between now and your scheduled surgery, please notify us at the above number.     Remember:  Do not eat after midnight the night before your surgery   You may drink clear liquids until 04:30 AM the morning of your surgery.   Clear liquids allowed are: Water, Non-Citrus Juices (without pulp), Carbonated Beverages, Clear Tea (no milk, honey, etc.), Black Coffee Only (NO MILK, CREAM OR POWDERED CREAMER of any kind), and Gatorade.    Take these medicines the morning of surgery with A SIP OF WATER  atorvastatin (LIPITOR)  sertraline (ZOLOFT)   May take these medicines IF NEEDED: acetaminophen (TYLENOL)  traMADol (ULTRAM)   One week prior to surgery, STOP taking any Aspirin (unless otherwise instructed by your surgeon) Aleve, Naproxen, Ibuprofen, Motrin, Advil, Goody's, BC's, all herbal medications, fish oil, and non-prescription vitamins.                     Do NOT Smoke (Tobacco/Vaping) for 24 hours prior to your procedure.  If you use a CPAP at night, you may bring your mask/headgear for your overnight stay.   You will be asked to remove any contacts, glasses, piercing's, hearing aid's, dentures/partials prior to surgery. Please bring cases for these items if needed.    Patients discharged the day of surgery will not be allowed to drive home, and someone needs to stay with them for 24 hours.  SURGICAL WAITING ROOM VISITATION Patients may have no more than 2 support people in the waiting area - these visitors may rotate.   Pre-op nurse will coordinate an appropriate time  for 1 ADULT support person, who may not rotate, to accompany patient in pre-op.  Children under the age of 72 must have an adult with them who is not the patient and must remain in the main waiting area with an adult.  If the patient needs to stay at the hospital during part of their recovery, the visitor guidelines for inpatient rooms apply.  Please refer to the [name] website for the visitor guidelines for any additional information.   If you received a COVID test during your pre-op visit  it is requested that you wear a mask when out in public, stay away from anyone that may not be feeling well and notify your surgeon if you develop symptoms. If you have been in contact with anyone that has tested positive in the last 10 days please notify you surgeon.      Pre-operative CHG Bathing Instructions   You can play a key role in reducing the risk of infection after surgery. Your skin needs to be as free of germs as possible. You can reduce the number of germs on your skin by washing with CHG (chlorhexidine gluconate) soap before surgery. CHG is an antiseptic soap that kills germs and continues to kill germs even after washing.   DO NOT use if you have an allergy to chlorhexidine/CHG or antibacterial soaps. If your skin becomes reddened or irritated, stop using the CHG and notify one of our RNs at [phone].  TAKE A SHOWER THE NIGHT BEFORE SURGERY AND THE DAY OF SURGERY    Please keep in mind the following:  DO NOT shave, including legs and underarms, 48 hours prior to surgery.   You may shave your face before/day of surgery.  Place clean sheets on your bed the night before surgery Use a clean washcloth (not used since being washed) for each shower. DO NOT sleep with pet's night before surgery.  CHG Shower Instructions:  Wash your face and private area with normal soap. If you choose to wash your hair, wash first with your normal shampoo.  After you use shampoo/soap,  rinse your hair and body thoroughly to remove shampoo/soap residue.  Turn the water OFF and apply half the bottle of CHG soap to a CLEAN washcloth.  Apply CHG soap ONLY FROM YOUR NECK DOWN TO YOUR TOES (washing for 3-5 minutes)  DO NOT use CHG soap on face, private areas, open wounds, or sores.  Pay special attention to the area where your surgery is being performed.  If you are having back surgery, having someone wash your back for you may be helpful. Wait 2 minutes after CHG soap is applied, then you may rinse off the CHG soap.  Pat dry with a clean towel  Put on clean pajamas    Additional instructions for the day of surgery: DO NOT APPLY any lotions, deodorants, cologne, or perfumes.   Do not wear jewelry or makeup Do not wear nail polish, gel polish, artificial nails, or any other type of covering on natural nails (fingers and toes) Do not bring valuables to the hospital. [facility] is not responsible for valuables/personal belongings. Put on clean/comfortable clothes.  Please brush your teeth.  Ask your nurse before applying any prescription medications to the skin.

## 2023-06-13 ENCOUNTER — Encounter (HOSPITAL_COMMUNITY): Payer: Self-pay

## 2023-06-13 ENCOUNTER — Other Ambulatory Visit: Payer: Self-pay

## 2023-06-13 ENCOUNTER — Encounter (HOSPITAL_COMMUNITY)
Admission: RE | Admit: 2023-06-13 | Discharge: 2023-06-13 | Disposition: A | Discharge status: Home / Self Care | Source: Ambulatory Visit | Attending: Surgery | Admitting: Surgery

## 2023-06-13 VITALS — BP 116/76 | HR 78 | Temp 98.4°F | Resp 17 | Ht 67.0 in | Wt 178.0 lb

## 2023-06-13 DIAGNOSIS — I619 Nontraumatic intracerebral hemorrhage, unspecified: Secondary | ICD-10-CM | POA: Diagnosis not present

## 2023-06-13 DIAGNOSIS — Z01818 Encounter for other preprocedural examination: Secondary | ICD-10-CM

## 2023-06-13 DIAGNOSIS — Z01812 Encounter for preprocedural laboratory examination: Secondary | ICD-10-CM | POA: Insufficient documentation

## 2023-06-13 HISTORY — DX: Unspecified osteoarthritis, unspecified site: M19.90

## 2023-06-13 LAB — CBC
HCT: 40.5 % (ref 36.0–46.0)
Hemoglobin: 13.6 g/dL (ref 12.0–15.0)
MCH: 29 pg (ref 26.0–34.0)
MCHC: 33.6 g/dL (ref 30.0–36.0)
MCV: 86.4 fL (ref 80.0–100.0)
Platelets: 252 10*3/uL (ref 150–400)
RBC: 4.69 MIL/uL (ref 3.87–5.11)
RDW: 14.1 % (ref 11.5–15.5)
WBC: 8.5 10*3/uL (ref 4.0–10.5)
nRBC: 0 % (ref 0.0–0.2)

## 2023-06-13 LAB — BASIC METABOLIC PANEL
Anion gap: 8 (ref 5–15)
BUN: 13 mg/dL (ref 6–20)
CO2: 28 mmol/L (ref 22–32)
Calcium: 9.5 mg/dL (ref 8.9–10.3)
Chloride: 101 mmol/L (ref 98–111)
Creatinine, Ser: 0.65 mg/dL (ref 0.44–1.00)
GFR, Estimated: >60 mL/min (ref >60)
Glucose, Bld: 96 mg/dL (ref 70–99)
Potassium: 3.9 mmol/L (ref 3.5–5.1)
Sodium: 137 mmol/L (ref 135–145)

## 2023-06-13 NOTE — Progress Notes (Signed)
 PCP - Peri Maris, FNP Cardiologist - denies Neurologist- Dr. Rona Ravens [facility])  PPM/ICD - denies   Chest x-ray - denies EKG - 02/21/19 (n/a) Stress Test - denies ECHO - 02/22/19 Cardiac Cath - denies  Sleep Study - denies   DM- denies  Last dose of GLP1 agonist-  n/a   ASA/Blood Thinner Instructions: n/a   ERAS Protcol - clears until 0430   COVID TEST- n/a   Anesthesia review: yes, hx of intracerebral hemorrhage. Pt goes to [facility] every 6 months. Had gamma knife treatment. Pt states she was told that while under anesthesia, her BP will just need to be monitored closely  Patient denies shortness of breath, fever, cough and chest pain at PAT appointment   All instructions explained to the patient, with a verbal understanding of the material. Patient agrees to go over the instructions while at home for a better understanding. The opportunity to ask questions was provided.

## 2023-06-14 NOTE — Progress Notes (Signed)
 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 She is s/p [facility] 01/2020.  Last seen 07/12/2022, stable at that time, recommended continue annual follow-up.  Preop labs reviewed, WNL.  MRI brain 07/12/2022 (Care Everywhere): IMPRESSION:   1. No change in the right occipital hemorrhagic cavity. No abnormal enhancement.      Zannie Cove [facility] Short Stay Center/Anesthesiology Phone [phone] 06/14/2023 4:00 PM

## 2023-06-14 NOTE — Anesthesia Preprocedure Evaluation (Addendum)
 Anesthesia Evaluation  Patient identified by MRN, date of birth, ID band Patient awake    Reviewed: Allergy & Precautions, NPO status , Patient's Chart, lab work & pertinent test results  Airway Mallampati: III  TM Distance: >3 FB Neck ROM: Full    Dental  (+) Teeth Intact, Dental Advisory Given   Pulmonary  Snores at night, no sleep study    Pulmonary exam normal breath sounds clear to auscultation       Cardiovascular negative cardio ROS Normal cardiovascular exam Rhythm:Regular Rate:Normal  TTE 2020  1. Left ventricular ejection fraction, by visual estimation, is 60 to  65%. The left ventricle has normal function. There is no left ventricular  hypertrophy.   2. Global right ventricle has normal systolic function.The right  ventricular size is normal. No increase in right ventricular wall  thickness.   3. Left atrial size was normal.   4. Right atrial size was normal.   5. Mild mitral annular calcification.   6. The mitral valve is normal in structure. Trace mitral valve  regurgitation. No evidence of mitral stenosis.   7. The tricuspid valve is normal in structure. Tricuspid valve  regurgitation is mild.   8. The aortic valve is tricuspid. Aortic valve regurgitation is not  visualized. Mild aortic valve sclerosis without stenosis.   9. The pulmonic valve was normal in structure. Pulmonic valve  regurgitation is not visualized.  10. Normal pulmonary artery systolic pressure.  11. 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 She is s/p gamma knife 01/2020 CVA, No Residual Symptoms    GI/Hepatic negative GI ROS, Neg liver ROS,,,  Endo/Other  negative endocrine ROS    Renal/GU negative Renal ROS   negative genitourinary   Musculoskeletal  (+) Arthritis , Osteoarthritis,    Abdominal   Peds  Hematology negative hematology ROS (+) Hb 13.6   Anesthesia Other Findings   Reproductive/Obstetrics Urine preg neg                             Anesthesia Physical Anesthesia Plan  ASA: 2  Anesthesia Plan: General   Post-op Pain Management: Tylenol PO (pre-op)*, Ketamine IV*, Dilaudid IV and Toradol IV (intra-op)*   Induction: Intravenous  PONV Risk Score and Plan: 3 and Midazolam, Dexamethasone and Ondansetron  Airway Management Planned: Oral ETT and Video Laryngoscope Planned  Additional Equipment: None  Intra-op Plan:   Post-operative Plan: Extubation in OR  Informed Consent: I have reviewed the patients History and Physical, chart, labs and discussed the procedure including the risks, benefits and alternatives for the proposed anesthesia with the patient or authorized representative who has indicated his/her understanding and acceptance.     Dental advisory given  Plan Discussed with: CRNA  Anesthesia Plan Comments:         Anesthesia Quick Evaluation

## 2023-06-20 ENCOUNTER — Ambulatory Visit (HOSPITAL_COMMUNITY)
Admission: RE | Admit: 2023-06-20 | Discharge: 2023-06-20 | Disposition: A | Discharge status: Home / Self Care | Source: Ambulatory Visit | Attending: Surgery | Admitting: Surgery

## 2023-06-20 ENCOUNTER — Encounter (HOSPITAL_COMMUNITY): Payer: Self-pay | Admitting: Surgery

## 2023-06-20 ENCOUNTER — Encounter (HOSPITAL_COMMUNITY)
Admission: RE | Disposition: A | Discharge status: Home / Self Care | Payer: Self-pay | Source: Ambulatory Visit | Attending: Surgery

## 2023-06-20 ENCOUNTER — Other Ambulatory Visit: Payer: Self-pay

## 2023-06-20 ENCOUNTER — Ambulatory Visit (HOSPITAL_COMMUNITY): Payer: Self-pay | Admitting: Physician Assistant

## 2023-06-20 ENCOUNTER — Ambulatory Visit (HOSPITAL_BASED_OUTPATIENT_CLINIC_OR_DEPARTMENT_OTHER): Payer: Self-pay | Admitting: Anesthesiology

## 2023-06-20 DIAGNOSIS — K8020 Calculus of gallbladder without cholecystitis without obstruction: Secondary | ICD-10-CM | POA: Diagnosis not present

## 2023-06-20 DIAGNOSIS — F419 Anxiety disorder, unspecified: Secondary | ICD-10-CM | POA: Insufficient documentation

## 2023-06-20 DIAGNOSIS — Z01818 Encounter for other preprocedural examination: Secondary | ICD-10-CM

## 2023-06-20 DIAGNOSIS — Z8673 Personal history of transient ischemic attack (TIA), and cerebral infarction without residual deficits: Secondary | ICD-10-CM | POA: Diagnosis not present

## 2023-06-20 DIAGNOSIS — K811 Chronic cholecystitis: Secondary | ICD-10-CM | POA: Insufficient documentation

## 2023-06-20 LAB — POCT PREGNANCY, URINE: Preg Test, Ur: NEGATIVE

## 2023-06-20 SURGERY — LAPAROSCOPIC CHOLECYSTECTOMY
Anesthesia: General | Site: Abdomen

## 2023-06-20 MED ORDER — OXYCODONE HCL 5 MG PO TABS
ORAL_TABLET | ORAL | Status: AC
  Filled 2023-06-20: qty 1

## 2023-06-20 MED ORDER — AMISULPRIDE (ANTIEMETIC) 5 MG/2ML IV SOLN: 10.0000 mg | Freq: Once | INTRAVENOUS | Status: DC | PRN

## 2023-06-20 MED ORDER — KETOROLAC TROMETHAMINE 30 MG/ML IJ SOLN
INTRAMUSCULAR | Status: DC | PRN
  Administered 2023-06-20: 30 mg via INTRAVENOUS

## 2023-06-20 MED ORDER — ORAL CARE MOUTH RINSE: 15.0000 mL | Freq: Once | OROMUCOSAL | Status: AC

## 2023-06-20 MED ORDER — SUGAMMADEX SODIUM 200 MG/2ML IV SOLN
INTRAVENOUS | Status: DC | PRN
  Administered 2023-06-20: 300 mg via INTRAVENOUS

## 2023-06-20 MED ORDER — CELECOXIB 200 MG PO CAPS
200.0000 mg | ORAL_CAPSULE | ORAL | Status: AC
Start: 2023-06-20 — End: 2023-06-20
  Administered 2023-06-20: 200 mg via ORAL
  Filled 2023-06-20: qty 1

## 2023-06-20 MED ORDER — DEXAMETHASONE SODIUM PHOSPHATE 10 MG/ML IJ SOLN
INTRAMUSCULAR | Status: AC
  Filled 2023-06-20: qty 1

## 2023-06-20 MED ORDER — BUPIVACAINE-EPINEPHRINE 0.25% -1:200000 IJ SOLN
INTRAMUSCULAR | Status: DC | PRN
  Administered 2023-06-20: 30 mL

## 2023-06-20 MED ORDER — 0.9 % SODIUM CHLORIDE (POUR BTL) OPTIME
TOPICAL | Status: DC | PRN
  Administered 2023-06-20: 1000 mL

## 2023-06-20 MED ORDER — FENTANYL CITRATE (PF) 100 MCG/2ML IJ SOLN
25.0000 ug | INTRAMUSCULAR | Status: DC | PRN
  Administered 2023-06-20 (×3): 50 ug via INTRAVENOUS

## 2023-06-20 MED ORDER — MIDAZOLAM HCL 2 MG/2ML IJ SOLN
INTRAMUSCULAR | Status: DC | PRN
  Administered 2023-06-20: 1 mg via INTRAVENOUS

## 2023-06-20 MED ORDER — PHENYLEPHRINE HCL (PRESSORS) 10 MG/ML IV SOLN
INTRAVENOUS | Status: DC | PRN
  Administered 2023-06-20: 160 ug via INTRAVENOUS
  Administered 2023-06-20: 80 ug via INTRAVENOUS

## 2023-06-20 MED ORDER — MIDAZOLAM HCL 2 MG/2ML IJ SOLN
INTRAMUSCULAR | Status: AC
  Filled 2023-06-20: qty 2

## 2023-06-20 MED ORDER — KETAMINE HCL 10 MG/ML IJ SOLN
INTRAMUSCULAR | Status: DC | PRN
  Administered 2023-06-20 (×2): 20 mg via INTRAVENOUS

## 2023-06-20 MED ORDER — DEXMEDETOMIDINE HCL IN NACL 80 MCG/20ML IV SOLN
INTRAVENOUS | Status: DC | PRN
  Administered 2023-06-20: 8 ug via INTRAVENOUS

## 2023-06-20 MED ORDER — FENTANYL CITRATE (PF) 100 MCG/2ML IJ SOLN
INTRAMUSCULAR | Status: AC
  Filled 2023-06-20: qty 2

## 2023-06-20 MED ORDER — PROPOFOL 10 MG/ML IV BOLUS
INTRAVENOUS | Status: DC | PRN
  Administered 2023-06-20: 50 mg via INTRAVENOUS
  Administered 2023-06-20: 200 mg via INTRAVENOUS

## 2023-06-20 MED ORDER — FENTANYL CITRATE (PF) 250 MCG/5ML IJ SOLN
INTRAMUSCULAR | Status: DC | PRN
  Administered 2023-06-20: 100 ug via INTRAVENOUS
  Administered 2023-06-20: 50 ug via INTRAVENOUS

## 2023-06-20 MED ORDER — ACETAMINOPHEN 500 MG PO TABS
1000.0000 mg | ORAL_TABLET | Freq: Once | ORAL | Status: AC
  Administered 2023-06-20: 1000 mg via ORAL
  Filled 2023-06-20: qty 2

## 2023-06-20 MED ORDER — LACTATED RINGERS IV SOLN: INTRAVENOUS | Status: DC

## 2023-06-20 MED ORDER — PROPOFOL 10 MG/ML IV BOLUS
INTRAVENOUS | Status: AC
  Filled 2023-06-20: qty 20

## 2023-06-20 MED ORDER — KETAMINE HCL 50 MG/5ML IJ SOSY
PREFILLED_SYRINGE | INTRAMUSCULAR | Status: AC
  Filled 2023-06-20: qty 5

## 2023-06-20 MED ORDER — FENTANYL CITRATE (PF) 250 MCG/5ML IJ SOLN
INTRAMUSCULAR | Status: AC
  Filled 2023-06-20: qty 5

## 2023-06-20 MED ORDER — LIDOCAINE 2% (20 MG/ML) 5 ML SYRINGE
INTRAMUSCULAR | Status: DC | PRN
Start: 2023-06-20 — End: 2023-06-20
  Administered 2023-06-20: 60 mg via INTRAVENOUS

## 2023-06-20 MED ORDER — DEXMEDETOMIDINE HCL IN NACL 80 MCG/20ML IV SOLN
INTRAVENOUS | Status: AC
  Filled 2023-06-20: qty 20

## 2023-06-20 MED ORDER — OXYCODONE HCL 5 MG PO TABS
5.0000 mg | ORAL_TABLET | Freq: Once | ORAL | Status: AC | PRN
  Administered 2023-06-20: 5 mg via ORAL

## 2023-06-20 MED ORDER — PHENYLEPHRINE 80 MCG/ML (10ML) SYRINGE FOR IV PUSH (FOR BLOOD PRESSURE SUPPORT)
PREFILLED_SYRINGE | INTRAVENOUS | Status: AC
  Filled 2023-06-20: qty 10

## 2023-06-20 MED ORDER — BUPIVACAINE-EPINEPHRINE (PF) 0.25% -1:200000 IJ SOLN
INTRAMUSCULAR | Status: AC
  Filled 2023-06-20: qty 30

## 2023-06-20 MED ORDER — LIDOCAINE 2% (20 MG/ML) 5 ML SYRINGE
INTRAMUSCULAR | Status: AC
Start: 2023-06-20 — End: ?
  Filled 2023-06-20: qty 5

## 2023-06-20 MED ORDER — KETOROLAC TROMETHAMINE 30 MG/ML IJ SOLN
INTRAMUSCULAR | Status: AC
  Filled 2023-06-20: qty 1

## 2023-06-20 MED ORDER — HYDROCODONE-ACETAMINOPHEN 5-325 MG PO TABS
1.0000 | ORAL_TABLET | Freq: Four times a day (QID) | ORAL | 0 refills | Status: AC | PRN
Start: 2023-06-20 — End: 2023-06-25

## 2023-06-20 MED ORDER — ONDANSETRON HCL 4 MG/2ML IJ SOLN
INTRAMUSCULAR | Status: AC
  Filled 2023-06-20: qty 2

## 2023-06-20 MED ORDER — ROCURONIUM BROMIDE 10 MG/ML (PF) SYRINGE
PREFILLED_SYRINGE | INTRAVENOUS | Status: DC | PRN
  Administered 2023-06-20: 80 mg via INTRAVENOUS

## 2023-06-20 MED ORDER — CEFAZOLIN SODIUM-DEXTROSE 2-4 GM/100ML-% IV SOLN
2.0000 g | INTRAVENOUS | Status: AC
  Administered 2023-06-20: 2 g via INTRAVENOUS
  Filled 2023-06-20: qty 100

## 2023-06-20 MED ORDER — CHLORHEXIDINE GLUCONATE 0.12 % MT SOLN
15.0000 mL | Freq: Once | OROMUCOSAL | Status: AC
  Administered 2023-06-20: 15 mL via OROMUCOSAL
  Filled 2023-06-20: qty 15

## 2023-06-20 MED ORDER — DEXAMETHASONE SODIUM PHOSPHATE 10 MG/ML IJ SOLN
INTRAMUSCULAR | Status: DC | PRN
  Administered 2023-06-20: 10 mg via INTRAVENOUS

## 2023-06-20 MED ORDER — ROCURONIUM BROMIDE 10 MG/ML (PF) SYRINGE
PREFILLED_SYRINGE | INTRAVENOUS | Status: AC
  Filled 2023-06-20: qty 10

## 2023-06-20 MED ORDER — PHENYLEPHRINE HCL-NACL 20-0.9 MG/250ML-% IV SOLN
INTRAVENOUS | Status: DC | PRN
  Administered 2023-06-20: 50 ug/min via INTRAVENOUS

## 2023-06-20 MED ORDER — OXYCODONE HCL 5 MG/5ML PO SOLN: 5.0000 mg | Freq: Once | ORAL | Status: AC | PRN

## 2023-06-20 MED ORDER — ONDANSETRON HCL 4 MG/2ML IJ SOLN
INTRAMUSCULAR | Status: DC | PRN
  Administered 2023-06-20: 4 mg via INTRAVENOUS

## 2023-06-20 SURGICAL SUPPLY — 35 items
APPLIER CLIP 5 13 M/L LIGAMAX5 (MISCELLANEOUS) ×1 IMPLANT
BAG COUNTER SPONGE SURGICOUNT (BAG) ×1 IMPLANT
CANISTER SUCT 3000ML PPV (MISCELLANEOUS) ×1 IMPLANT
CHLORAPREP W/TINT 26 (MISCELLANEOUS) ×1 IMPLANT
CLIP APPLIE 5 13 M/L LIGAMAX5 (MISCELLANEOUS) ×1 IMPLANT
COVER SURGICAL LIGHT HANDLE (MISCELLANEOUS) ×1 IMPLANT
DERMABOND ADVANCED .7 DNX12 (GAUZE/BANDAGES/DRESSINGS) ×1 IMPLANT
ELECT REM PT RETURN 9FT ADLT (ELECTROSURGICAL) ×1 IMPLANT
ELECTRODE REM PT RTRN 9FT ADLT (ELECTROSURGICAL) ×1 IMPLANT
GLOVE BIOGEL PI IND STRL 6 (GLOVE) ×1 IMPLANT
GLOVE BIOGEL PI MICRO STRL 5.5 (GLOVE) ×1 IMPLANT
GOWN STRL REUS W/ TWL LRG LVL3 (GOWN DISPOSABLE) ×3 IMPLANT
IRRIG SUCT STRYKERFLOW 2 WTIP (MISCELLANEOUS) ×1 IMPLANT
IRRIGATION SUCT STRKRFLW 2 WTP (MISCELLANEOUS) ×1 IMPLANT
KIT BASIN OR (CUSTOM PROCEDURE TRAY) ×1 IMPLANT
KIT TURNOVER KIT B (KITS) ×1 IMPLANT
L-HOOK LAP DISP 36CM (ELECTROSURGICAL) ×1 IMPLANT
LHOOK LAP DISP 36CM (ELECTROSURGICAL) ×1 IMPLANT
NS IRRIG 1000ML POUR BTL (IV SOLUTION) ×1 IMPLANT
PAD ARMBOARD POSITIONER FOAM (MISCELLANEOUS) ×1 IMPLANT
PENCIL BUTTON HOLSTER BLD 10FT (ELECTRODE) ×1 IMPLANT
SCISSORS LAP 5X35 DISP (ENDOMECHANICALS) ×1 IMPLANT
SET TUBE SMOKE EVAC HIGH FLOW (TUBING) ×1 IMPLANT
SLEEVE Z-THREAD 5X100MM (TROCAR) ×2 IMPLANT
SUT MNCRL AB 4-0 PS2 18 (SUTURE) ×1 IMPLANT
SUT VICRYL 0 UR6 27IN ABS (SUTURE) IMPLANT
SYS BAG RETRIEVAL 10MM (BASKET) ×1 IMPLANT
SYSTEM BAG RETRIEVAL 10MM (BASKET) IMPLANT
TOWEL GREEN STERILE (TOWEL DISPOSABLE) ×1 IMPLANT
TOWEL GREEN STERILE FF (TOWEL DISPOSABLE) ×1 IMPLANT
TRAY LAPAROSCOPIC MC (CUSTOM PROCEDURE TRAY) ×1 IMPLANT
TROCAR BALLN 12MMX100 BLUNT (TROCAR) ×1 IMPLANT
TROCAR Z-THREAD OPTICAL 5X100M (TROCAR) ×1 IMPLANT
WARMER LAPAROSCOPE (MISCELLANEOUS) ×1 IMPLANT
WATER STERILE IRR 1000ML POUR (IV SOLUTION) ×1 IMPLANT

## 2023-06-20 NOTE — Anesthesia Postprocedure Evaluation (Signed)
 Anesthesia Post Note  Patient: [name] [name]  Procedure(s) Performed: LAPAROSCOPIC CHOLECYSTECTOMY (Abdomen)     Patient location during evaluation: PACU Anesthesia Type: General Level of consciousness: awake and alert, oriented and patient cooperative Pain management: pain level controlled Vital Signs Assessment: post-procedure vital signs reviewed and stable Respiratory status: spontaneous breathing, nonlabored ventilation and respiratory function stable Cardiovascular status: blood pressure returned to baseline and stable Postop Assessment: no apparent nausea or vomiting Anesthetic complications: no   No notable events documented.  Last Vitals:  Vitals:   06/20/23 0930 06/20/23 0945  BP: (!) 112/59 106/71  Pulse: 75 74  Resp: 15 14  Temp:  36.6 C  SpO2: 98% 94%    Last Pain:  Vitals:   06/20/23 0935  TempSrc:   PainSc: 4                  Lannie Fields

## 2023-06-20 NOTE — Discharge Instructions (Addendum)
 [facility] DISCHARGE INSTRUCTIONS  Activity No heavy lifting greater than 15 pounds for 4 weeks after surgery. Ok to shower in 24 hours, but do not bathe or submerge incisions underwater. Do not drive while taking narcotic pain medication. You may drive when you are no longer taking prescription pain medication, you can comfortably wear a seatbelt, and you can safely maneuver your car and apply brakes.  Wound Care Your incisions are covered with skin glue called Dermabond. This will peel off on its own over time. You may shower and allow warm soapy water to run over your incisions. Gently pat dry. Do not submerge your incision underwater until cleared by your surgeon. Monitor your incision for any new redness, tenderness, or drainage. Many patients will experience some swelling and bruising at the incisions.  Ice packs will help.  Swelling and bruising can take several days to resolve.   Medications A  prescription for pain medication may be given to you upon discharge.  Take your pain medication as prescribed, if needed.  If narcotic pain medicine is not needed, then you may take acetaminophen (Tylenol) or ibuprofen (Advil) as needed. It is common to experience some constipation if taking pain medication after surgery.  Increasing fluid intake and taking a stool softener (such as Colace) will usually help or prevent this problem from occurring.  A mild laxative (Milk of Magnesia or Miralax) should be taken according to package directions if there are no bowel movements after 48 hours. Take your usually prescribed medications unless otherwise directed. If you need a refill on your pain medication, please contact your pharmacy.  They will contact our office to request authorization. Prescriptions will not be filled after 5 pm or on weekends.  When to Call us: Fever greater than 100.5 New redness, drainage, or swelling at incision site Severe pain, nausea, or  vomiting Persistent bleeding from incisions Jaundice (yellowing of the whites of the eyes or skin)  Follow-up You have an appointment scheduled with Dr. Freida Busman on April 16 at 10:20am. This will be at the [facility] office at [address], [address], [place], [place]. Please arrive at least 15 minutes prior to your scheduled appointment time.  IF YOU HAVE DISABILITY OR FAMILY LEAVE FORMS, YOU MUST BRING THEM TO THE OFFICE FOR PROCESSING.   DO NOT GIVE THEM TO YOUR DOCTOR.  The clinic staff is available to answer your questions during regular business hours.  Please don't hesitate to call and ask to speak to one of the nurses for clinical concerns.  If you have a medical emergency, go to the nearest emergency room or call 911.  A surgeon from [facility] is always on call at the hospital  [address], [place], [place]  [address] ?  [address] [address], [place], [place]   [address] [phone] ? Toll Free: [phone] ? FAX [phone] Web site: [email or web address]      Managing Your Pain After Surgery Without Opioids    Thank you for participating in our program to help patients manage their pain after surgery without opioids. This is part of our effort to provide you with the best care possible, without exposing you or your family to the risk that opioids pose.  What pain can I expect after surgery? You can expect to have some pain after surgery. This is normal. The pain is typically worse the day after surgery, and quickly begins to get better. Many studies have found  that many patients are able to manage their pain after surgery with Over-the-Counter (OTC) medications such as Tylenol and Motrin. If you have a condition that does not allow you to take Tylenol or Motrin, notify your surgical team.  How will I manage my pain? The best strategy for controlling your pain after surgery is around the clock pain control with Tylenol  (acetaminophen) and Motrin (ibuprofen or Advil). Alternating these medications with each other allows you to maximize your pain control. In addition to Tylenol and Motrin, you can use heating pads or ice packs on your incisions to help reduce your pain.  How will I alternate your regular strength over-the-counter pain medication? You will take a dose of pain medication every three hours. Start by taking 650 mg of Tylenol (2 pills of 325 mg) 3 hours later take 600 mg of Motrin (3 pills of 200 mg) 3 hours after taking the Motrin take 650 mg of Tylenol 3 hours after that take 600 mg of Motrin.   - 1 -  See example - if your first dose of Tylenol is at 12:00 PM   12:00 PM Tylenol 650 mg (2 pills of 325 mg)  3:00 PM Motrin 600 mg (3 pills of 200 mg)  6:00 PM Tylenol 650 mg (2 pills of 325 mg)  9:00 PM Motrin 600 mg (3 pills of 200 mg)  Continue alternating every 3 hours   We recommend that you follow this schedule around-the-clock for at least 3 days after surgery, or until you feel that it is no longer needed. Use the table on the last page of this handout to keep track of the medications you are taking. Important: Do not take more than 3000mg  of Tylenol or 3200mg  of Motrin in a 24-hour period. Do not take ibuprofen/Motrin if you have a history of bleeding stomach ulcers, severe kidney disease, &/or actively taking a blood thinner  What if I still have pain? If you have pain that is not controlled with the over-the-counter pain medications (Tylenol and Motrin or Advil) you might have what we call "breakthrough" pain. You will receive a prescription for a small amount of an opioid pain medication such as Oxycodone, Tramadol, or Tylenol with Codeine. Use these opioid pills in the first 24 hours after surgery if you have breakthrough pain. Do not take more than 1 pill every 4-6 hours.  If you still have uncontrolled pain after using all opioid pills, don't hesitate to call our staff using the  number provided. We will help make sure you are managing your pain in the best way possible, and if necessary, we can provide a prescription for additional pain medication.   Day 1    Time  Name of Medication Number of pills taken  Amount of Acetaminophen  Pain Level   Comments  AM PM       AM PM       AM PM       AM PM       AM PM       AM PM       AM PM       AM PM       Total Daily amount of Acetaminophen Do not take more than  3,000 mg per day      Day 2    Time  Name of Medication Number of pills taken  Amount of Acetaminophen  Pain Level   Comments  AM PM  AM PM       AM PM       AM PM       AM PM       AM PM       AM PM       AM PM       Total Daily amount of Acetaminophen Do not take more than  3,000 mg per day      Day 3    Time  Name of Medication Number of pills taken  Amount of Acetaminophen  Pain Level   Comments  AM PM       AM PM       AM PM       AM PM         AM PM       AM PM       AM PM       AM PM       Total Daily amount of Acetaminophen Do not take more than  3,000 mg per day      Day 4    Time  Name of Medication Number of pills taken  Amount of Acetaminophen  Pain Level   Comments  AM PM       AM PM       AM PM       AM PM       AM PM       AM PM       AM PM       AM PM       Total Daily amount of Acetaminophen Do not take more than  3,000 mg per day      Day 5    Time  Name of Medication Number of pills taken  Amount of Acetaminophen  Pain Level   Comments  AM PM       AM PM       AM PM       AM PM       AM PM       AM PM       AM PM       AM PM       Total Daily amount of Acetaminophen Do not take more than  3,000 mg per day      Day 6    Time  Name of Medication Number of pills taken  Amount of Acetaminophen  Pain Level  Comments  AM PM       AM PM       AM PM       AM PM       AM PM       AM PM       AM PM       AM PM       Total Daily amount of Acetaminophen Do  not take more than  3,000 mg per day      Day 7    Time  Name of Medication Number of pills taken  Amount of Acetaminophen  Pain Level   Comments  AM PM       AM PM       AM PM       AM PM       AM PM       AM PM       AM PM       AM PM  Total Daily amount of Acetaminophen Do not take more than  3,000 mg per day        For additional information about how and where to safely dispose of unused opioid medications - [employer]  Disclaimer: This document contains information and/or instructional materials adapted from [facility] for the typical patient with your condition. It does not replace medical advice from your health care provider because your experience may differ from that of the typical patient. Talk to your health care provider if you have any questions about this document, your condition or your treatment plan. Adapted from [facility]

## 2023-06-20 NOTE — Transfer of Care (Signed)
 Immediate Anesthesia Transfer of Care Note  Patient: [name] [name]  Procedure(s) Performed: LAPAROSCOPIC CHOLECYSTECTOMY (Abdomen)  Patient Location: PACU  Anesthesia Type:General  Level of Consciousness: awake, alert , patient cooperative, and responds to stimulation  Airway & Oxygen Therapy: Patient Spontanous Breathing and Patient connected to nasal cannula oxygen  Post-op Assessment: Report given to RN  Post vital signs: Reviewed and stable   Last Vitals:  Vitals Value Taken Time  BP 107/63 06/20/23 0845  Temp 36.6 C 06/20/23 0845  Pulse 85 06/20/23 0851  Resp 19 06/20/23 0851  SpO2 95 % 06/20/23 0851  Vitals shown include unfiled device data.  Last Pain:  Vitals:   06/20/23 0845  TempSrc:   PainSc: 0-No pain      Patients Stated Pain Goal: 0 (06/20/23 0600)  Complications: No notable events documented.

## 2023-06-20 NOTE — Interval H&P Note (Signed)
 History and Physical Interval Note:  06/20/2023 7:25 AM  [name] [name]  has presented today for surgery, with the diagnosis of GALLSTONES.  The various methods of treatment have been discussed with the patient and family. After consideration of risks, benefits and other options for treatment, the patient has consented to  Procedure(s): LAPAROSCOPIC CHOLECYSTECTOMY (N/A) as a surgical intervention.  The patient's history has been reviewed, patient examined, no change in status, stable for surgery.  I have reviewed the patient's chart and labs.  Questions were answered to the patient's satisfaction.     Fritzi Mandes

## 2023-06-20 NOTE — Op Note (Signed)
 Date: 06/20/23  Patient: [name] [name] MRN: [id number]  Preoperative Diagnosis: Symptomatic cholelithiasis Postoperative Diagnosis: Same  Procedure: Laparoscopic cholecystectomy  Surgeon: Sophronia Simas, MD Assistant: Gevena Cotton, RNFA  EBL: 20 mL  Anesthesia: General endotracheal  Specimens: Gallbladder  Indications: [name] is a 48 yo female who was referred with episode RUQ abdominal pain. RUQ US showed cholelithiasis. She has continued to have intermittent severe episodes of upper abdominal pain. After a discussion of the risks and benefits of surgery, she elected to proceed with cholecystectomy.  Findings: No signs of acute cholecystitis.  Procedure details: Informed consent was obtained in the preoperative area prior to the procedure. The patient was brought to the operating room and placed on the table in the supine position. General anesthesia was induced and appropriate lines and drains were placed for intraoperative monitoring. Perioperative antibiotics were administered per SCIP guidelines. The abdomen was prepped and draped in the usual sterile fashion. A pre-procedure timeout was taken verifying patient identity, surgical site and procedure to be performed.  A small infraumbilical skin incision was made, the subcutaneous tissue was divided with cautery, and the umbilical stalk was grasped and elevated. The fascia was incised and the peritoneal cavity was directly visualized. A 12mm Hassan trocar was placed and the abdomen was insufflated. The peritoneal cavity was inspected with no evidence of visceral or vascular injury. Three 5mm ports were placed in the right subcostal margin, all under direct visualization. The fundus of the gallbladder was grasped and retracted cephalad. There were omental adhesions to the fundus and body of the gallbladder, which were taken down using cautery. The infundibulum was then retracted laterally. The peritoneum overlying the  gallbladder was incised, and the cystic triangle was dissected out using cautery and blunt dissection.  The critical view of safety was obtained, and the cystic duct and cystic artery were clipped and divided, leaving two clips behind on the cystic duct stump. The gallbladder was taken off the liver using cautery, and the specimen was placed in an endocatch bag. Hemostasis was achieved in the gallbladder fossa using cautery. The cystic duct and artery stumps were visually inspected and there was no evidence of bile leak or bleeding. The ports were removed under direct visualization and the abdomen was desufflated. The specimen was extracted via the umbilical port site and sent for routine pathology. The umbilical port site fascia was closed with a 0 vicryl pursestring suture. The skin at all port sites was closed with 4-0 monocryl subcuticular suture. Dermabond was applied.  The patient tolerated the procedure well with no apparent complications. All counts were correct x2 at the end of the procedure. The patient was extubated and taken to PACU in stable condition.  Sophronia Simas, MD 06/20/23 8:29 AM

## 2023-06-20 NOTE — Progress Notes (Signed)
 Wasted Fentanyl 50 mcg with Kathrynn Humble, RN as witness.

## 2023-06-20 NOTE — Anesthesia Procedure Notes (Signed)
 Procedure Name: Intubation Date/Time: 06/20/2023 7:40 AM  Performed by: Shary Decamp, CRNAPre-anesthesia Checklist: Patient identified, Patient being monitored, Timeout performed, Emergency Drugs available and Suction available Patient Re-evaluated:Patient Re-evaluated prior to induction Oxygen Delivery Method: Circle System Utilized Preoxygenation: Pre-oxygenation with 100% oxygen Induction Type: IV induction Ventilation: Mask ventilation without difficulty Laryngoscope Size: Miller and 2 Grade View: Grade I Tube type: Oral Tube size: 7.0 mm Number of attempts: 1 Airway Equipment and Method: Stylet Placement Confirmation: ETT inserted through vocal cords under direct vision, positive ETCO2 and breath sounds checked- equal and bilateral Secured at: 21 cm Tube secured with: Tape Dental Injury: Teeth and Oropharynx as per pre-operative assessment

## 2023-06-21 ENCOUNTER — Encounter (HOSPITAL_COMMUNITY): Payer: Self-pay | Admitting: Surgery

## 2023-06-21 LAB — SURGICAL PATHOLOGY

## 2023-07-20 ENCOUNTER — Encounter (HOSPITAL_BASED_OUTPATIENT_CLINIC_OR_DEPARTMENT_OTHER): Payer: Self-pay | Admitting: Emergency Medicine

## 2023-07-20 ENCOUNTER — Other Ambulatory Visit: Payer: Self-pay

## 2023-07-20 ENCOUNTER — Emergency Department (HOSPITAL_BASED_OUTPATIENT_CLINIC_OR_DEPARTMENT_OTHER)

## 2023-07-20 ENCOUNTER — Emergency Department (HOSPITAL_BASED_OUTPATIENT_CLINIC_OR_DEPARTMENT_OTHER)
Admission: EM | Admit: 2023-07-20 | Discharge: 2023-07-20 | Disposition: A | Discharge status: Home / Self Care | Attending: Emergency Medicine | Admitting: Emergency Medicine

## 2023-07-20 DIAGNOSIS — R519 Headache, unspecified: Secondary | ICD-10-CM | POA: Diagnosis not present

## 2023-07-20 DIAGNOSIS — H5340 Unspecified visual field defects: Secondary | ICD-10-CM | POA: Diagnosis present

## 2023-07-20 LAB — BASIC METABOLIC PANEL WITH GFR
Anion gap: 11 (ref 5–15)
BUN: 14 mg/dL (ref 6–20)
CO2: 24 mmol/L (ref 22–32)
Calcium: 9.4 mg/dL (ref 8.9–10.3)
Chloride: 103 mmol/L (ref 98–111)
Creatinine, Ser: 0.75 mg/dL (ref 0.44–1.00)
GFR, Estimated: >60 mL/min (ref >60)
Glucose, Bld: 113 mg/dL — ABNORMAL HIGH (ref 70–99)
Potassium: 3.2 mmol/L — ABNORMAL LOW (ref 3.5–5.1)
Sodium: 138 mmol/L (ref 135–145)

## 2023-07-20 LAB — CBC WITH DIFFERENTIAL/PLATELET
Abs Immature Granulocytes: 0.02 10*3/uL (ref 0.00–0.07)
Basophils Absolute: 0 10*3/uL (ref 0.0–0.1)
Basophils Relative: 1 %
Eosinophils Absolute: 0.1 10*3/uL (ref 0.0–0.5)
Eosinophils Relative: 1 %
HCT: 38.4 % (ref 36.0–46.0)
Hemoglobin: 13.3 g/dL (ref 12.0–15.0)
Immature Granulocytes: 0 %
Lymphocytes Relative: 27 %
Lymphs Abs: 2.1 10*3/uL (ref 0.7–4.0)
MCH: 29 pg (ref 26.0–34.0)
MCHC: 34.6 g/dL (ref 30.0–36.0)
MCV: 83.8 fL (ref 80.0–100.0)
Monocytes Absolute: 0.7 10*3/uL (ref 0.1–1.0)
Monocytes Relative: 9 %
Neutro Abs: 4.9 10*3/uL (ref 1.7–7.7)
Neutrophils Relative %: 62 %
Platelets: 221 10*3/uL (ref 150–400)
RBC: 4.58 MIL/uL (ref 3.87–5.11)
RDW: 13.9 % (ref 11.5–15.5)
WBC: 7.9 10*3/uL (ref 4.0–10.5)
nRBC: 0 % (ref 0.0–0.2)

## 2023-07-20 LAB — HCG, SERUM, QUALITATIVE: Preg, Serum: NEGATIVE

## 2023-07-20 LAB — CBG MONITORING, ED: Glucose-Capillary: 121 mg/dL — ABNORMAL HIGH (ref 70–99)

## 2023-07-20 MED ORDER — IOHEXOL 350 MG/ML SOLN
75.0000 mL | Freq: Once | INTRAVENOUS | Status: AC | PRN
  Administered 2023-07-20: 75 mL via INTRAVENOUS

## 2023-07-20 NOTE — ED Notes (Signed)
 Symptoms started at 1045

## 2023-07-20 NOTE — ED Triage Notes (Signed)
 Pt had gamma knife surgery in 2021. She states her symptoms were no vision in the left quadrant of both eyes . She had these symptoms about a month ago. Saw her MD 2 weeks later and had MRI, said brain swelling. Told if came again to get CT at ED. . MD called to triage.

## 2023-07-20 NOTE — ED Provider Notes (Signed)
 [place] EMERGENCY DEPARTMENT AT [facility] Provider Note   CSN: [id number] Arrival date & time: 07/20/23  1116     History  Chief Complaint  Patient presents with   Visual Field Change    [name] [name] is a 48 y.o. female.  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  2 weeks ago she followed up with [facility] for her normal check and had not had any further symptoms but had her typical MRI and they told her she had some edema.  She was on a week of prednisone which she finished about a week ago and was otherwise feeling fine until 30 to 45 minutes before she arrived today she suddenly lost vision again in the same area.  Today she describes losing vision in the upper half of both eyes.  She reports she looked at a mailbox in the ankle only see the bottom half but at times says its mostly just the upper left quadrant.  She reports it is almost resolved but still little blurry in that area currently.  She has a minimal headache but denies severe pain.  No eye pain.  No nausea or vomiting.  No head trauma.  She does not take any anticoagulation.  She denies any speech difficulty, facial drooping, difficulty walking, numbness or weakness to the arms or legs.  Her husband is present at bedside and confirms  The history is provided by the patient and the spouse.       Home Medications Prior to Admission medications   Medication Sig Start Date End Date Taking? Authorizing Provider  acetaminophen  (TYLENOL ) 500 MG tablet Take 1,000 mg by mouth every 8 (eight) hours as needed for moderate pain.    [provider]  atorvastatin  (LIPITOR) 10 MG  tablet Take 1 tablet (10 mg total) by mouth daily. 02/24/19 06/20/23  Rodman Clam, NP  calcium  carbonate (TUMS - DOSED IN MG ELEMENTAL CALCIUM ) 500 MG chewable tablet Chew 1-2 tablets by mouth daily as needed for indigestion or heartburn.    [provider]  cholecalciferol (VITAMIN D3) 25 MCG (1000 UNIT) tablet Take 1,000 Units by mouth daily.    [provider]  Multiple Vitamin (MULTIVITAMIN) capsule Take 1 capsule by mouth daily.    [provider]  sertraline  (ZOLOFT ) 100 MG tablet Take 150 mg by mouth daily.  11/26/18   [provider]      Allergies    Other    Review of Systems   Review of Systems  Physical Exam Updated Vital Signs BP (!) 140/83 (BP Location: Right Arm)   Pulse 98   Temp 97.8 F (36.6 C)   Resp 20   SpO2 98%  Physical Exam Vitals and nursing note reviewed.  Constitutional:      General: She is not in acute distress.    Appearance: She is well-developed.  HENT:     Head: Normocephalic and atraumatic.  Eyes:     General: No visual field deficit.    Pupils: Pupils are equal, round, and reactive to light.  Cardiovascular:     Rate and Rhythm: Normal rate and regular rhythm.  Heart sounds: Normal heart sounds. No murmur heard.    No friction rub.  Pulmonary:     Effort: Pulmonary effort is normal.     Breath sounds: Normal breath sounds. No wheezing or rales.  Abdominal:     General: Bowel sounds are normal. There is no distension.     Palpations: Abdomen is soft.     Tenderness: There is no abdominal tenderness. There is no guarding or rebound.  Musculoskeletal:        General: No tenderness. Normal range of motion.     Comments: No edema  Skin:    General: Skin is warm and dry.     Findings: No rash.  Neurological:     Mental Status: She is alert and oriented to person, place, and time.     Cranial Nerves: No cranial nerve deficit, dysarthria or facial asymmetry.     Sensory: Sensation is intact.      Motor: Motor function is intact. No pronator drift.     Coordination: Coordination is intact.     Gait: Gait is intact.  Psychiatric:        Behavior: Behavior normal.     ED Results / Procedures / Treatments   Labs (all labs ordered are listed, but only abnormal results are displayed) Labs Reviewed  BASIC METABOLIC PANEL WITH GFR - Abnormal; Notable for the following components:      Result Value   Potassium 3.2 (*)    Glucose, Bld 113 (*)    All other components within normal limits  CBG MONITORING, ED - Abnormal; Notable for the following components:   Glucose-Capillary 121 (*)    All other components within normal limits  CBC WITH DIFFERENTIAL/PLATELET  HCG, SERUM, QUALITATIVE    EKG None  Radiology CT ANGIO HEAD NECK W WO CM Result Date: 07/20/2023 CLINICAL DATA:  Deficit, acute stroke suspected. 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 Recurrent symptoms. EXAM: CT ANGIOGRAPHY HEAD AND NECK WITH AND WITHOUT CONTRAST TECHNIQUE: Multidetector CT imaging of the head and neck was performed using the standard protocol during bolus administration of intravenous contrast. Multiplanar CT image reconstructions and MIPs were obtained to evaluate the vascular anatomy. Carotid stenosis measurements (when applicable) are obtained utilizing NASCET criteria, using the distal internal carotid diameter as the denominator. RADIATION DOSE REDUCTION: This exam was performed according to the departmental dose-optimization program which includes automated exposure control, adjustment of the mA and/or kV according to patient size and/or use of iterative reconstruction technique. CONTRAST:  75mL OMNIPAQUE  IOHEXOL  350 MG/ML SOLN COMPARISON:  MR head without and with contrast 09/03/2019 FINDINGS: CT HEAD FINDINGS Brain: Chronic encephalomalacia of the right occipital lobe is stable. No acute infarct, hemorrhage, or mass lesion is present. No significant white matter lesions are present. Deep  brain nuclei are within normal limits. The ventricles are of normal size. No significant extraaxial fluid collection is present. Midline structures are within normal limits. The brainstem and cerebellum are within normal limits. Vascular: No hyperdense vessel or unexpected calcification. Skull: Calvarium is intact. No focal lytic or blastic lesions are present. No significant extracranial soft tissue lesion is present. Sinuses/Orbits: The paranasal sinuses and mastoid air cells are clear. The globes and orbits are within normal limits. Review of the MIP images confirms the above findings CTA NECK FINDINGS Aortic arch: A 3 vessel arch configuration is present. No significant atherosclerotic changes are present at the arch. Great vessel origins are widely patent within normal limits. Right carotid system:  The right common carotid artery is within normal limits. Bifurcation is unremarkable. The cervical right ICA is normal. Left carotid system: The left common carotid artery is within normal limits. Bifurcation is unremarkable. The cervical left ICA is normal. Vertebral arteries: The right vertebral artery is the dominant vessel. Both vertebral arteries originate from the subclavian arteries without significant stenosis. No significant stenosis is present in either vertebral artery in the neck. Skeleton: The vertebral body heights and alignment are normal. Other neck: Stent The soft tissues of the neck are unremarkable. Salivary glands are within normal limits. Thyroid is normal. No significant adenopathy is present. No focal mucosal or submucosal lesions are present. Upper chest: The lung apices are clear. The thoracic inlet is within normal limits. Review of the MIP images confirms the above findings CTA HEAD FINDINGS Anterior circulation: The internal carotid arteries within normal limits from the skull base to the ICA termini. The right A1 segment is aplastic. Both A2 segments fill from the left. The M1 segments  are normal bilaterally. The MCA branch vessels are normal. ACA branch vessels are normal bilaterally. No aneurysm is present. Posterior circulation: The PICA origins are visualized and normal. Vertebrobasilar junction basilar artery normal. The superior cerebellar arteries are patent. The left superior cerebellar artery is duplicated. Both posterior cerebral arteries originate scratched at the left posterior cerebral artery originates from the basilar tip with contribution from a small left posterior communicating artery. The right posterior cerebral artery is of fetal type with contribution from a small P1 segment. PCA branch vessels are normal bilaterally. Increased distal branch vessels are present in the right compatible with the known fistula. No hemorrhage or other complication is evident. Venous sinuses: The dural sinuses are patent. The straight sinus and deep cerebral veins are intact. Cortical veins are within normal limits. No significant vascular malformation is evident. Anatomic variants: Fetal type right posterior cerebral artery. Review of the MIP images confirms the above findings IMPRESSION: 1. Stable chronic encephalomalacia of the right occipital lobe. 2. No acute intracranial abnormality or significant interval change. 3. Normal CTA of the neck. 4. 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 No hemorrhage or other complication is evident. 5. No other significant proximal stenosis, aneurysm, or branch vessel occlusion within the Circle of Willis. Electronically Signed   By: Audree Leas M.D.   On: 07/20/2023 13:00    Procedures Procedures    Medications Ordered in ED Medications  iohexol  (OMNIPAQUE ) 350 MG/ML injection 75 mL (75 mLs Intravenous Contrast Given 07/20/23 1233)    ED Course/ Medical Decision Making/ A&P                                 Medical Decision Making Amount and/or Complexity of Data Reviewed Labs: ordered.  Decision-making details documented in ED Course. Radiology: ordered and independent interpretation performed. Decision-making details documented in ED Course.  Risk Prescription drug management.   Pt with multiple medical problems and comorbidities and presenting today with a complaint that caries a high risk for morbidity and mortality.  Here today with the above symptoms and want to confirm that patient is not having recurrent bleeding from her known dural sinus AVM.  Patient denies any trauma and takes no anticoagulation.  Currently her symptoms are almost resolved.  No obvious deficits with visual field testing here.  She is mentating normally.  I independently interpreted patient's labs and CBC,  BMP within normal limits.  Patient when she was last at [facility] they had told her if this ever happens again she needs to go in for a CAT scan to ensure there is been no further bleeding.  Blood pressure is mildly elevated at 140/83 but otherwise vital signs are normal. I have independently visualized and interpreted pt's images today.  Head CT without obvious signs of bleed.  Radiology reports stable chronic encephalomalacia of the right occipital lobe with no acute intracranial abnormality or interval change within normal CTA of the neck.  Also did state increased distal branch vessels in the right PCA compatible with the known fistula but no hemorrhage or other complication noted.  No signs of proximal stenosis aneurysm or branch vessel occlusion. Discussed the results with the patient and her husband.  They will call [facility] on Monday for follow-up instructions.  She was cautioned if she had any new neurologic findings to return to the emergency room.  All visual changes have resolved.          Final Clinical Impression(s) / ED Diagnoses Final diagnoses:  Visual field defect    Rx / DC Orders ED Discharge Orders     None         Almond Army, MD 07/20/23 1345

## 2023-07-20 NOTE — Discharge Instructions (Signed)
 The CAT scan today did not show any new findings.  There was no sign of bleeding or new problems with the blood vessels.  However if you were to develop recurrent symptoms that were not going away or you started having brand-new symptoms like numbness in an extremity or your face or weakness or difficulty speaking you would need to return to the emergency room immediately.

## 2023-07-20 NOTE — ED Notes (Signed)
 No symptoms presently

## 2023-08-18 ENCOUNTER — Encounter (HOSPITAL_COMMUNITY): Payer: Self-pay

## 2023-08-18 ENCOUNTER — Other Ambulatory Visit: Payer: Self-pay

## 2023-08-18 ENCOUNTER — Emergency Department (HOSPITAL_COMMUNITY)

## 2023-08-18 ENCOUNTER — Emergency Department (HOSPITAL_COMMUNITY)
Admission: EM | Admit: 2023-08-18 | Discharge: 2023-08-19 | Disposition: A | Discharge status: Home / Self Care | Attending: Emergency Medicine | Admitting: Emergency Medicine

## 2023-08-18 DIAGNOSIS — H538 Other visual disturbances: Secondary | ICD-10-CM | POA: Insufficient documentation

## 2023-08-18 DIAGNOSIS — Z8673 Personal history of transient ischemic attack (TIA), and cerebral infarction without residual deficits: Secondary | ICD-10-CM | POA: Insufficient documentation

## 2023-08-18 DIAGNOSIS — H539 Unspecified visual disturbance: Secondary | ICD-10-CM

## 2023-08-18 LAB — CBC WITH DIFFERENTIAL/PLATELET
Abs Immature Granulocytes: 0.02 10*3/uL (ref 0.00–0.07)
Basophils Absolute: 0 10*3/uL (ref 0.0–0.1)
Basophils Relative: 1 %
Eosinophils Absolute: 0.1 10*3/uL (ref 0.0–0.5)
Eosinophils Relative: 1 %
HCT: 39.4 % (ref 36.0–46.0)
Hemoglobin: 13.7 g/dL (ref 12.0–15.0)
Immature Granulocytes: 0 %
Lymphocytes Relative: 26 %
Lymphs Abs: 1.9 10*3/uL (ref 0.7–4.0)
MCH: 29.5 pg (ref 26.0–34.0)
MCHC: 34.8 g/dL (ref 30.0–36.0)
MCV: 84.7 fL (ref 80.0–100.0)
Monocytes Absolute: 0.7 10*3/uL (ref 0.1–1.0)
Monocytes Relative: 9 %
Neutro Abs: 4.7 10*3/uL (ref 1.7–7.7)
Neutrophils Relative %: 63 %
Platelets: 273 10*3/uL (ref 150–400)
RBC: 4.65 MIL/uL (ref 3.87–5.11)
RDW: 14.2 % (ref 11.5–15.5)
WBC: 7.4 10*3/uL (ref 4.0–10.5)
nRBC: 0 % (ref 0.0–0.2)

## 2023-08-18 LAB — COMPREHENSIVE METABOLIC PANEL WITH GFR
ALT: 15 U/L (ref 0–44)
AST: 19 U/L (ref 15–41)
Albumin: 3.5 g/dL (ref 3.5–5.0)
Alkaline Phosphatase: 38 U/L (ref 38–126)
Anion gap: 7 (ref 5–15)
BUN: 14 mg/dL (ref 6–20)
CO2: 22 mmol/L (ref 22–32)
Calcium: 8.3 mg/dL — ABNORMAL LOW (ref 8.9–10.3)
Chloride: 110 mmol/L (ref 98–111)
Creatinine, Ser: 0.57 mg/dL (ref 0.44–1.00)
GFR, Estimated: >60 mL/min (ref >60)
Glucose, Bld: 87 mg/dL (ref 70–99)
Potassium: 3.3 mmol/L — ABNORMAL LOW (ref 3.5–5.1)
Sodium: 139 mmol/L (ref 135–145)
Total Bilirubin: 0.5 mg/dL (ref 0.0–1.2)
Total Protein: 6.7 g/dL (ref 6.5–8.1)

## 2023-08-18 MED ORDER — LORAZEPAM 2 MG/ML IJ SOLN: 0.5000 mg | Freq: Once | INTRAMUSCULAR | Status: DC

## 2023-08-18 MED ORDER — IOHEXOL 350 MG/ML SOLN
75.0000 mL | Freq: Once | INTRAVENOUS | Status: AC | PRN
  Administered 2023-08-18: 75 mL via INTRAVENOUS

## 2023-08-18 MED ORDER — GADOBUTROL 1 MMOL/ML IV SOLN
8.0000 mL | Freq: Once | INTRAVENOUS | Status: AC | PRN
  Administered 2023-08-18: 8 mL via INTRAVENOUS

## 2023-08-18 NOTE — ED Provider Notes (Cosign Needed Addendum)
 3 months of BIL left hemianopsia. Occurred this morning. Self resolved. MRI 4/15. CTA wo aneurysm, does show right occipital lobe lesion.    48 y.o. female history of migraines and depression. She had [name] right occipital hemorrhage from right occipital pial arteriovenous malformation (AVM) with attempted embolization OSH . Embo was not successful and aborted. She is scheduled for [facility] to AVM with Dr. Eber [name] 02/04/2020.   If MRI neg can dc home with Neurosurgery FU Physical Exam  BP 109/79 (BP Location: Right Arm)   Pulse 90   Temp 98.6 F (37 C) (Oral)   Resp 18   Ht 5\' 7"  (1.702 m)   Wt 79.4 kg   LMP 07/28/2023   SpO2 100%   BMI 27.41 kg/m   Physical Exam Vitals and nursing note reviewed.  Constitutional:      General: She is not in acute distress.    Appearance: She is well-developed. She is not ill-appearing.  HENT:     Head: Atraumatic.  Eyes:     Pupils: Pupils are equal, round, and reactive to light.  Cardiovascular:     Rate and Rhythm: Normal rate.  Pulmonary:     Effort: No respiratory distress.  Abdominal:     General: There is no distension.  Musculoskeletal:        General: Normal range of motion.     Cervical back: Normal range of motion.  Skin:    General: Skin is warm and dry.  Neurological:     General: No focal deficit present.     Mental Status: She is alert.  Psychiatric:        Mood and Affect: Mood normal.     Procedures  Procedures Labs Reviewed  COMPREHENSIVE METABOLIC PANEL WITH GFR - Abnormal; Notable for the following components:      Result Value   Potassium 3.3 (*)    Calcium  8.3 (*)    All other components within normal limits  CBC WITH DIFFERENTIAL/PLATELET   MR Brain W and Wo Contrast Result Date: 08/18/2023 CLINICAL DATA:  Increased subcortical white matter hypoattenuation in the right occipital lobe since prior study. EXAM: MRI HEAD WITHOUT AND WITH CONTRAST TECHNIQUE: Multiplanar, multiecho pulse sequences of the brain and  surrounding structures were obtained without and with intravenous contrast. CONTRAST:  8mL GADAVIST  GADOBUTROL  1 MMOL/ML IV SOLN COMPARISON:  None Available. FINDINGS: Brain: Small focus of enhancement in the right occipital lobe with edema that is progressed since September 03, 2019 MRI at the site of prior hemorrhage. There is subcu artifact and some encephalomalacia in this region, compatible with prior hemorrhage. No evidence of acute infarct, acute hemorrhage, midline shift, or hydrocephalus. Vascular: Better evaluated on same day CTA. Skull and upper cervical spine: Normal marrow signal. Sinuses/Orbits: Clear sinuses.  No acute orbital findings. IMPRESSION: Small focus of ill-defined enhancement in the right occipital lobe with edema that is progressed since September 03, 2019 MRI (at the site of prior hemorrhage). Given the patient's history of prior AVM status post gamma knife this could represent post treatment change, but recurrent AVM is not excluded. [name] catheter arteriogram could provide more sensitive evaluation if clinically warranted. Additionally, direct comparison with outside MRI from April could evaluate for change if that study is available. Findings discussed with provider [name] [name] via telephone at 10:38 p.m. Electronically Signed   By: [name] [name] M.D.   On: 08/18/2023 22:42   CT Angio Head Neck W WO CM Result Date: 08/18/2023 EXAM: CT HEAD WITHOUT CTA  HEAD AND NECK WITH AND WITHOUT 08/18/2023 03:40:08 PM TECHNIQUE: CTA of the head and neck was performed with and without the administration of intravenous contrast. Noncontrast CT of the head with reconstructed 2-D images are also provided for review. Multiplanar 2D and/or 3D reformatted images are provided for review. Automated exposure control, iterative reconstruction, and/or weight based adjustment of the mA/kV was utilized to reduce the radiation dose to as low as reasonably achievable. COMPARISON: CT angio of the neck and CT head without  contrast 07/20/2023. CLINICAL HISTORY: Left hemianopsia with right occipital lesion known. FINDINGS: CT HEAD: BRAIN AND VENTRICLES: No acute intracranial hemorrhage. No mass effect or midline shift. No extra-axial fluid collection. Gray-white differentiation is maintained. No hydrocephalus. Subcortical white matter hypoattenuation in the right occipital lobe increased since prior study. ORBITS: No acute abnormality. SINUSES: No acute abnormality. SOFT TISSUES AND SKULL: No acute abnormality. CTA NECK: AORTIC ARCH AND ARCH VESSELS: No dissection or arterial injury. No significant stenosis of the brachiocephalic or subclavian arteries. CERVICAL CAROTID ARTERIES: No dissection, arterial injury, or hemodynamically significant stenosis by NASCET criteria. CERVICAL VERTEBRAL ARTERIES: No dissection, arterial injury, or significant stenosis. VISUALIZED LUNGS AND MEDIASTINUM: Unremarkable. SOFT TISSUES: No acute abnormality. BONES: No acute abnormality. CTA HEAD: ANTERIOR CIRCULATION: The right A1 segment is aplastic. Both A2 segments fill from the left. No significant stenosis of the internal carotid arteries. No significant stenosis of the anterior cerebral arteries. No significant stenosis of the middle cerebral arteries. No aneurysm. POSTERIOR CIRCULATION: No significant stenosis of the posterior cerebral arteries. No significant stenosis of the basilar artery. No significant stenosis of the vertebral arteries. No aneurysm. OTHER: No dural venous sinus thrombosis on this non-dedicated study. IMPRESSION: 1. Increased subcortical white matter hypoattenuation in the right occipital lobe since prior study. Recommend MRI of the brain without and with contrast to evaluate for additional lesion beyond the chronic encephalomalacia. 2. No large vessel occlusion, hemodynamically significant stenosis, or aneurysm in the head or neck. Electronically signed by: [name] Leas MD 08/18/2023 04:23 PM EDT RP Workstation:  [id number]   CT ANGIO HEAD NECK W WO CM Result Date: 07/20/2023 CLINICAL DATA:  Deficit, acute stroke suspected. 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 Recurrent symptoms. EXAM: CT ANGIOGRAPHY HEAD AND NECK WITH AND WITHOUT CONTRAST TECHNIQUE: Multidetector CT imaging of the head and neck was performed using the standard protocol during bolus administration of intravenous contrast. Multiplanar CT image reconstructions and MIPs were obtained to evaluate the vascular anatomy. Carotid stenosis measurements (when applicable) are obtained utilizing NASCET criteria, using the distal internal carotid diameter as the denominator. RADIATION DOSE REDUCTION: This exam was performed according to the departmental dose-optimization program which includes automated exposure control, adjustment of the mA and/or kV according to patient size and/or use of iterative reconstruction technique. CONTRAST:  75mL OMNIPAQUE  IOHEXOL  350 MG/ML SOLN COMPARISON:  MR head without and with contrast 09/03/2019 FINDINGS: CT HEAD FINDINGS Brain: Chronic encephalomalacia of the right occipital lobe is stable. No acute infarct, hemorrhage, or mass lesion is present. No significant white matter lesions are present. Deep brain nuclei are within normal limits. The ventricles are of normal size. No significant extraaxial fluid collection is present. Midline structures are within normal limits. The brainstem and cerebellum are within normal limits. Vascular: No hyperdense vessel or unexpected calcification. Skull: Calvarium is intact. No focal lytic or blastic lesions are present. No significant extracranial soft tissue lesion is present. Sinuses/Orbits: The paranasal sinuses and mastoid air cells are clear. The globes and orbits  are within normal limits. Review of the MIP images confirms the above findings CTA NECK FINDINGS Aortic arch: [name] 3 vessel arch configuration is present. No significant atherosclerotic changes are present at the  arch. Great vessel origins are widely patent within normal limits. Right carotid system: The right common carotid artery is within normal limits. Bifurcation is unremarkable. The cervical right ICA is normal. Left carotid system: The left common carotid artery is within normal limits. Bifurcation is unremarkable. The cervical left ICA is normal. Vertebral arteries: The right vertebral artery is the dominant vessel. Both vertebral arteries originate from the subclavian arteries without significant stenosis. No significant stenosis is present in either vertebral artery in the neck. Skeleton: The vertebral body heights and alignment are normal. Other neck: Stent The soft tissues of the neck are unremarkable. Salivary glands are within normal limits. Thyroid is normal. No significant adenopathy is present. No focal mucosal or submucosal lesions are present. Upper chest: The lung apices are clear. The thoracic inlet is within normal limits. Review of the MIP images confirms the above findings CTA HEAD FINDINGS Anterior circulation: The internal carotid arteries within normal limits from the skull base to the ICA termini. The right A1 segment is aplastic. Both A2 segments fill from the left. The M1 segments are normal bilaterally. The MCA branch vessels are normal. ACA branch vessels are normal bilaterally. No aneurysm is present. Posterior circulation: The PICA origins are visualized and normal. Vertebrobasilar junction basilar artery normal. The superior cerebellar arteries are patent. The left superior cerebellar artery is duplicated. Both posterior cerebral arteries originate scratched at the left posterior cerebral artery originates from the basilar tip with contribution from [name] small left posterior communicating artery. The right posterior cerebral artery is of fetal type with contribution from [name] small P1 segment. PCA branch vessels are normal bilaterally. Increased distal branch vessels are present in the right  compatible with the known fistula. No hemorrhage or other complication is evident. Venous sinuses: The dural sinuses are patent. The straight sinus and deep cerebral veins are intact. Cortical veins are within normal limits. No significant vascular malformation is evident. Anatomic variants: Fetal type right posterior cerebral artery. Review of the MIP images confirms the above findings IMPRESSION: 1. Stable chronic encephalomalacia of the right occipital lobe. 2. No acute intracranial abnormality or significant interval change. 3. Normal CTA of the neck. 4. 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 No hemorrhage or other complication is evident. 5. No other significant proximal stenosis, aneurysm, or branch vessel occlusion within the Circle of Willis. Electronically Signed   By: [name] [name] M.D.   On: 07/20/2023 13:00    ED Course / MDM      Care assumed from previous provider plan to follow-up on MRI for disposition  Discussed with radiology, unclear if treatment changes vs recurrent AVM?  Discussed with Dr. Murvin Arthurs, rec consulting with Neuro IR for her AVM  Discussed with Dr. Alvira Josephs who recommends consulting with [facility] given last seen there  Labs and imaging personally viewed and interpreted   Discussed results with patient.  PowerShared images to [facility].  [facility] consulted. Pending return call.   Care transferred to oncoming provider who will follow-up with [facility] for recommendations.     Medical Decision Making Amount and/or Complexity of Data Reviewed External Data Reviewed: labs, radiology and notes. Labs: ordered. Decision-making details documented in ED Course. Radiology: ordered and independent interpretation performed. Decision-making details documented in ED Course.  Risk  OTC drugs. Prescription drug management. Decision regarding hospitalization. Diagnosis or treatment significantly limited by social determinants of  health.       [name] Pat A, PA-C 08/18/23 2359

## 2023-08-18 NOTE — ED Triage Notes (Signed)
 Pt has hx of hemorrhagic stroke. For the last few months pt loses half vision in both eyes then sees a flashing light then vision comes back. This has happened 3 time in the last 3 months. Pt could feel this episode coming on. LSW [id number].  Pt last MRI did show some brain swelling. Pt having slight dizziness at this time. No unilateral weakness.

## 2023-08-18 NOTE — ED Notes (Signed)
 Patient transported to CT

## 2023-08-18 NOTE — ED Provider Notes (Signed)
[place] EMERGENCY DEPARTMENT AT [facility] Provider Note   CSN: [id number] Arrival date & time: 08/18/23  1206     History  No chief complaint on file.   [name] [name] is a 48 y.o. 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  Patient states that she has had 3 episodes in the past 3 months of hemianopsia neck, various times including eating breakfast this morning, driving.  Patient states it feels that the left side of her vision both of her eyes become stated.  Patient is not on any blood thinners and denies any trauma to the head, headaches.  Patient states that she gets regular MRIs every 6 months by her neurosurgeon up in [place] .  Patient denies any weakness, paresthesias and is only concerned about the left hemianopsia.  Patient denies any ocular history and does not wear contacts or glasses and denies any eye pain or eye redness.  Home Medications Prior to Admission medications   Medication Sig Start Date End Date Taking? Authorizing Provider  acetaminophen  (TYLENOL ) 500 MG tablet Take 1,000 mg by mouth every 8 (eight) hours as needed for moderate pain.    [provider]  atorvastatin  (LIPITOR) 10 MG tablet Take 1 tablet (10 mg total) by mouth daily. 02/24/19 06/20/23  Rodman Clam, NP  calcium  carbonate (TUMS - DOSED IN MG ELEMENTAL CALCIUM ) 500 MG chewable tablet Chew 1-2 tablets by mouth daily as needed for indigestion or heartburn.    [provider]  cholecalciferol (VITAMIN D3) 25 MCG (1000 UNIT) tablet Take 1,000 Units by mouth daily.    [provider]  Multiple Vitamin (MULTIVITAMIN) capsule Take 1 capsule by mouth daily.    [provider]  sertraline  (ZOLOFT ) 100 MG tablet Take 150 mg by mouth daily.  11/26/18   [provider]      Allergies    Other    Review of Systems   Review of  Systems  Physical Exam Updated Vital Signs BP 127/76   Pulse 89   Temp 98.8 F (37.1 C) (Oral)   Resp 18   Ht 5\' 7"  (1.702 m)   Wt 79.4 kg   LMP 07/28/2023   SpO2 99%   BMI 27.41 kg/m  Physical Exam Vitals reviewed.  Constitutional:      General: She is not in acute distress. HENT:     Head: Normocephalic and atraumatic.  Eyes:     Extraocular Movements: Extraocular movements intact.     Conjunctiva/sclera: Conjunctivae normal.     Pupils: Pupils are equal, round, and reactive to light.  Cardiovascular:     Rate and Rhythm: Normal rate and regular rhythm.     Pulses: Normal pulses.     Heart sounds: Normal heart sounds.     Comments: 2+ bilateral radial/dorsalis pedis pulses with regular rate Pulmonary:     Effort: Pulmonary effort is normal. No respiratory distress.     Breath sounds: Normal breath sounds.  Abdominal:     Palpations: Abdomen is soft.     Tenderness: There is no abdominal tenderness. There is no guarding or rebound.  Musculoskeletal:        General: Normal range of motion.     Cervical back: Normal range of motion and neck supple.     Comments: 5 out of 5 bilateral grip/leg extension strength  Skin:    General: Skin is  warm and dry.     Capillary Refill: Capillary refill takes less than 2 seconds.  Neurological:     General: No focal deficit present.     Mental Status: She is alert and oriented to person, place, and time.     Sensory: Sensation is intact.     Motor: Motor function is intact.     Coordination: Coordination is intact.     Gait: Gait is intact.     Comments: Sensation intact in all 4 limbs Vision grossly intact No visual field deficits Cranial nerves III through XII intact  Psychiatric:        Mood and Affect: Mood normal.     ED Results / Procedures / Treatments   Labs (all labs ordered are listed, but only abnormal results are displayed) Labs Reviewed  COMPREHENSIVE METABOLIC PANEL WITH GFR  CBC WITH  DIFFERENTIAL/PLATELET    EKG None  Radiology No results found.  Procedures Procedures    Medications Ordered in ED Medications - No data to display  ED Course/ Medical Decision Making/ A&P                                 Medical Decision Making Amount and/or Complexity of Data Reviewed Labs: ordered. Radiology: ordered.  Risk Prescription drug management.   [name] [name] 48 y.o. presented today for hemianopsia. Working DDx that I considered at this time includes, but not limited to, ICH, CVA/TIA, aneurysm changes, tumor.  R/o DDx: Pending  Review of prior external notes: 07/22/2023 telephone  Unique Tests and My Independent Interpretation:  CBC: Unremarkable CMP: Unremarkable CTA head neck: Increased cortical white matter hypoattenuation right occipital lobe since previous study, recommend MRI brain with and without contrast MRI brain with and without contrast: Pending  Social Determinants of Health: none  Discussion with Independent Historian: Husband  Discussion of Management of Tests: None  Risk: Low: based on diagnostic testing/clinical impression and treatment plan  Risk Stratification Score: None  Staffed with Belfi, MD  Plan: On exam patient was no acute distress with stable vitals.  Patient was initially evaluated in triage by this provider and had a reassuring neurologic exam including visual fields are intact and patient is currently asymptomatic.  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  Upon reevaluation patient is still asymptomatic.  Labs are reassuring and will continue with CTA.  CTA shows increase of cortical white matter hypoattenuation in the right occipital lobe since prior study and radiology recommends MRI brain with and without.  Will order this along with Ativan .  Patient signed out to  [facility] , PA-C.  Please review their note for the continuation of patient's care.  The plan at this point is follow-up on MRI and if negative can discharge however if there are any acute pathology reach out to neurology.  This chart was dictated using voice recognition software.  Despite best efforts to proofread,  errors can occur which can change the documentation meaning.         Final Clinical Impression(s) / ED Diagnoses Final diagnoses:  None    Rx / DC Orders ED Discharge Orders     None         Elex Grimmer 08/18/23 2052    Hershel Los, MD 08/19/23  0707  

## 2023-08-18 NOTE — ED Notes (Signed)
 Patient transported to MRI

## 2023-08-19 NOTE — Discharge Instructions (Addendum)
 I would follow-up with Dr. Eber Goldsmith as soon as you can.  Images have been power shared to [facility]. Should symptoms acutely worsen, can present to nearest ER or go directly to [facility] for urgent evaluation.

## 2023-08-19 NOTE — ED Provider Notes (Signed)
  1:39 AM Spoke with NSG resident at [facility] (did not give name)-- unable to give any recommendations over the phone and states Dr. Eber Goldsmith is not on call and unavailable as well.  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 Would have to facilitate a transfer for formal recommendations.  Patient has now been in the ER for 13.5 hours.  She is very agitated at the situation. On reassessment she is currently asymptomatic.  She is ambulatory in room with stable vitals.  I have discussed options of going through transfer center and initiating this, however patient just wants to go home.  States she will follow-up with Dr. Eber Goldsmith this week.  She voices that if any symptoms worsen, she will go to [facility] directly.  I feel this is reasonable as it does sound like she has had the symptoms over the past 3 months and not acutely new today.  Images have been power shared and she was given disc of her images.  She understands should symptoms acutely worsen, seek emergent care as closest facility vs going directly to [facility].  She will return here for new concerns.   Coretha Dew, PA-C 08/19/23 3086    Rory Collard, MD 08/19/23 [phone]

## 2023-08-19 NOTE — ED Notes (Signed)
 Scan disc given to pt by XR.

## 2023-08-19 NOTE — ED Notes (Signed)
 ED Provider at bedside.

## 2023-09-20 ENCOUNTER — Encounter (HOSPITAL_COMMUNITY): Payer: Self-pay | Admitting: Interventional Radiology

## 2024-02-07 DIAGNOSIS — I671 Cerebral aneurysm, nonruptured: Secondary | ICD-10-CM | POA: Diagnosis not present

## 2024-02-11 DIAGNOSIS — I671 Cerebral aneurysm, nonruptured: Secondary | ICD-10-CM | POA: Diagnosis not present

## 2024-02-12 NOTE — Telephone Encounter (Signed)
 If you could let [name] [name] know that her MRV was ok.  The veins look ok, with no clear obstruction.  I had hoped we might have an explanation for your underwater feeling.  Apologize to her for how long the read took.

## 2024-03-03 DIAGNOSIS — Z9889 Other specified postprocedural states: Secondary | ICD-10-CM | POA: Diagnosis not present

## 2024-03-03 DIAGNOSIS — R569 Unspecified convulsions: Secondary | ICD-10-CM | POA: Diagnosis not present

## 2024-03-03 DIAGNOSIS — I671 Cerebral aneurysm, nonruptured: Secondary | ICD-10-CM | POA: Diagnosis not present

## 2024-03-04 DIAGNOSIS — G9389 Other specified disorders of brain: Secondary | ICD-10-CM | POA: Diagnosis not present

## 2024-03-04 DIAGNOSIS — G936 Cerebral edema: Secondary | ICD-10-CM | POA: Diagnosis not present

## 2024-03-04 DIAGNOSIS — Q282 Arteriovenous malformation of cerebral vessels: Secondary | ICD-10-CM | POA: Diagnosis not present

## 2024-03-16 DIAGNOSIS — Q282 Arteriovenous malformation of cerebral vessels: Secondary | ICD-10-CM | POA: Diagnosis not present

## 2024-03-16 DIAGNOSIS — G936 Cerebral edema: Secondary | ICD-10-CM | POA: Diagnosis not present

## 2024-04-27 ENCOUNTER — Other Ambulatory Visit: Payer: Self-pay

## 2024-04-27 ENCOUNTER — Emergency Department (HOSPITAL_BASED_OUTPATIENT_CLINIC_OR_DEPARTMENT_OTHER)
Admission: EM | Admit: 2024-04-27 | Discharge: 2024-04-27 | Disposition: A | Discharge status: Home / Self Care | Attending: Emergency Medicine | Admitting: Emergency Medicine

## 2024-04-27 ENCOUNTER — Encounter (HOSPITAL_BASED_OUTPATIENT_CLINIC_OR_DEPARTMENT_OTHER): Payer: Self-pay

## 2024-04-27 ENCOUNTER — Other Ambulatory Visit (HOSPITAL_BASED_OUTPATIENT_CLINIC_OR_DEPARTMENT_OTHER): Payer: Self-pay

## 2024-04-27 DIAGNOSIS — R519 Headache, unspecified: Secondary | ICD-10-CM | POA: Insufficient documentation

## 2024-04-27 DIAGNOSIS — H9202 Otalgia, left ear: Secondary | ICD-10-CM | POA: Insufficient documentation

## 2024-04-27 MED ORDER — HYDROCODONE-ACETAMINOPHEN 5-325 MG PO TABS
2.0000 | ORAL_TABLET | ORAL | 0 refills | Status: AC | PRN
  Filled 2024-04-27: qty 10, 2d supply, fill #0

## 2024-04-27 MED ORDER — VALACYCLOVIR HCL 1 G PO TABS
1000.0000 mg | ORAL_TABLET | Freq: Three times a day (TID) | ORAL | 0 refills | Status: AC
  Filled 2024-04-27: qty 21, 7d supply, fill #0

## 2024-04-27 MED ORDER — KETOROLAC TROMETHAMINE 10 MG PO TABS
10.0000 mg | ORAL_TABLET | Freq: Four times a day (QID) | ORAL | 0 refills | Status: AC | PRN
  Filled 2024-04-27: qty 20, 5d supply, fill #0

## 2024-04-27 NOTE — ED Triage Notes (Signed)
 Pt POV with husband d/t left ear pain for 1 week and now radiates to left eye, left hairline.  Pt did have flu-like symptoms in early January with cough that is finally going away now.  Pt also has hx of ruptured DAVF, seizures and  gamma knife radiation . Pt is on Hydrocortisone tablets for brain swelling.

## 2024-04-27 NOTE — Discharge Instructions (Addendum)
 Your symptoms of facial pain and ear pain could be due to early shingles which we will discharge you on a course of valacyclovir  for 7 days.  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  Oral pain medication has also been prescribed.

## 2024-04-27 NOTE — ED Notes (Signed)
 Pt d/c instructions, medications, and follow-up care reviewed with pt. Pt verbalized understanding and had no further questions at time of d/c. Pt CA&Ox4, ambulatory, and in NAD at time of d/c
